# Patient Record
Sex: Female | Born: 1960 | Race: Black or African American | Hispanic: No | Marital: Single | Smoking: Current every day smoker
Health system: Southern US, Community
[De-identification: ages and names within clinical notes are randomized; demographics above are authoritative.]

## PROBLEM LIST (undated history)

## (undated) DIAGNOSIS — E785 Hyperlipidemia, unspecified: Secondary | ICD-10-CM

## (undated) HISTORY — PX: BREAST BIOPSY: SHX20

## (undated) HISTORY — DX: Hyperlipidemia, unspecified: E78.5

---

## 2004-06-09 ENCOUNTER — Other Ambulatory Visit: Admission: RE | Admit: 2004-06-09 | Discharge: 2004-06-09 | Payer: Self-pay | Admitting: Family Medicine

## 2004-06-18 ENCOUNTER — Encounter: Admission: RE | Admit: 2004-06-18 | Discharge: 2004-06-18 | Payer: Self-pay | Admitting: Family Medicine

## 2006-02-10 ENCOUNTER — Other Ambulatory Visit: Admission: RE | Admit: 2006-02-10 | Discharge: 2006-02-10 | Payer: Self-pay | Admitting: Family Medicine

## 2006-02-10 ENCOUNTER — Ambulatory Visit: Payer: Self-pay | Admitting: Family Medicine

## 2006-02-10 ENCOUNTER — Encounter: Payer: Self-pay | Admitting: Family Medicine

## 2006-02-17 ENCOUNTER — Encounter: Admission: RE | Admit: 2006-02-17 | Discharge: 2006-02-17 | Payer: Self-pay | Admitting: Family Medicine

## 2006-02-22 ENCOUNTER — Ambulatory Visit: Payer: Self-pay | Admitting: Family Medicine

## 2006-02-24 ENCOUNTER — Encounter: Admission: RE | Admit: 2006-02-24 | Discharge: 2006-02-24 | Payer: Self-pay | Admitting: Family Medicine

## 2006-03-17 ENCOUNTER — Ambulatory Visit: Payer: Self-pay | Admitting: Family Medicine

## 2007-02-08 IMAGING — US US ABDOMEN COMPLETE
1 series · 14 of 25 positions shown · non-contrast
Comparison: none

CLINICAL DATA: Abdominal pain, bloating. 
 ABDOMEN ULTRASOUND:
TECHNIQUE: Complete abdominal ultrasound examination was performed including evaluation of the liver, gallbladder, bile ducts, pancreas, kidneys, spleen, IVC, and abdominal aorta.

[Series 1: unknown · 14 of 72 slices shown]
[im 1/72]
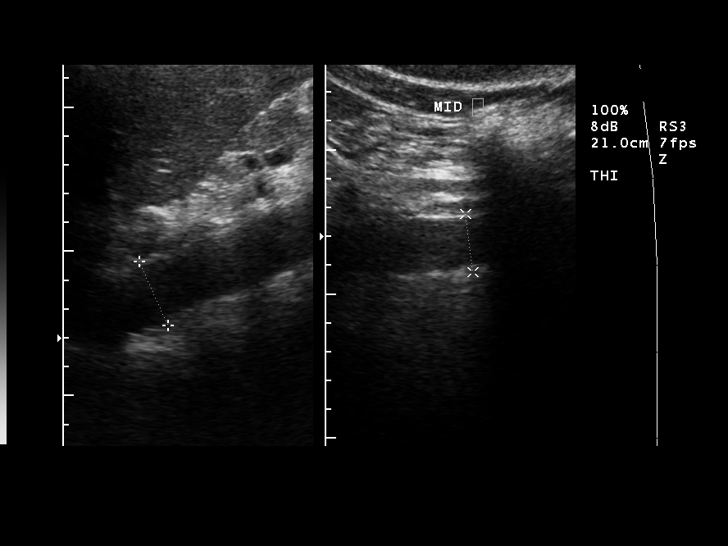
[im 6/72]
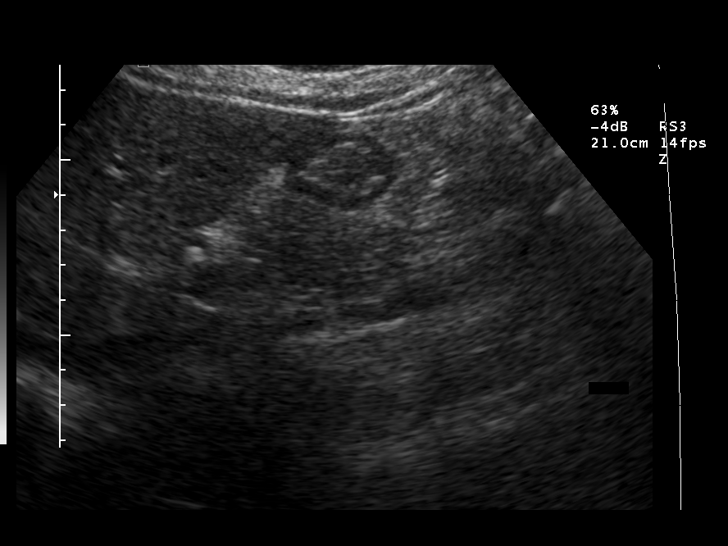
[im 12/72]
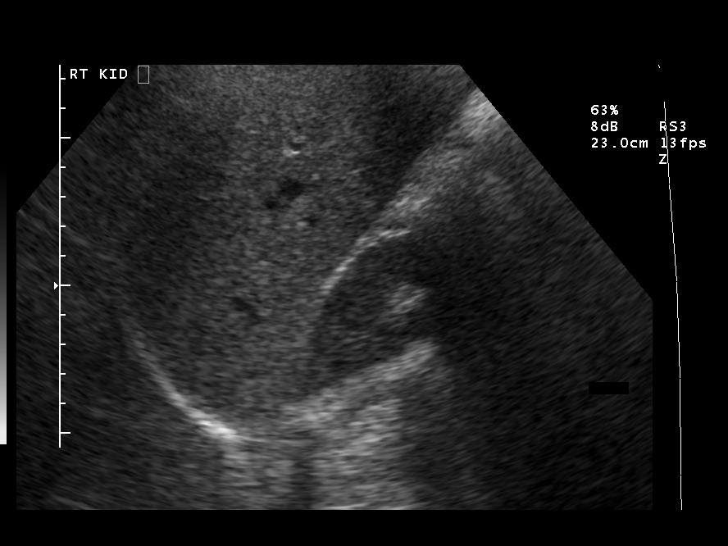
[im 18/72]
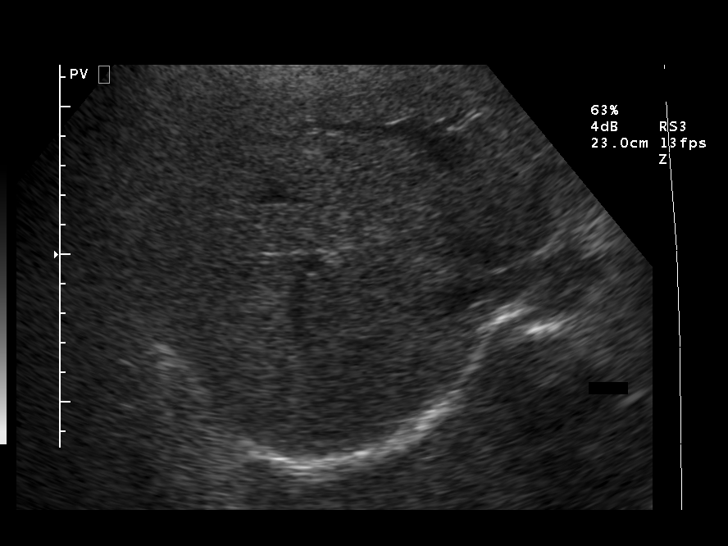
[im 24/72]
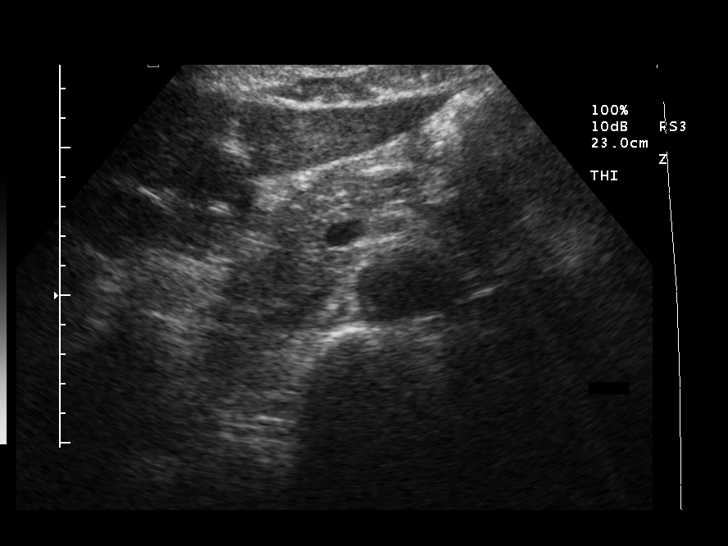
[im 27/72]
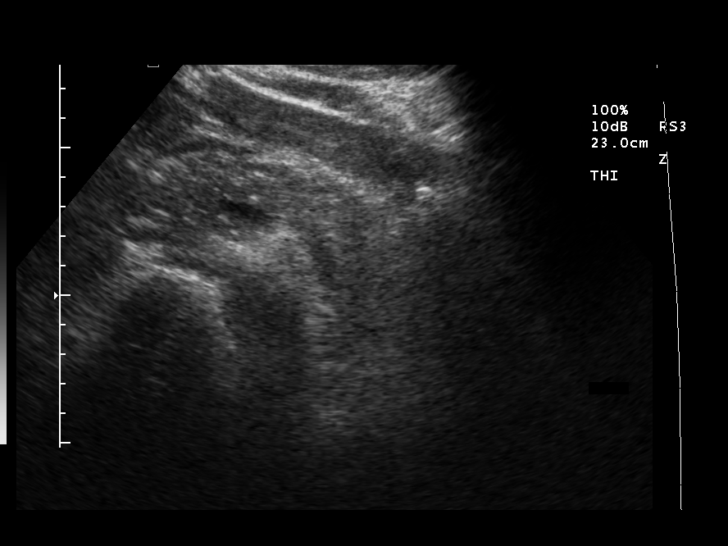
[im 33/72]
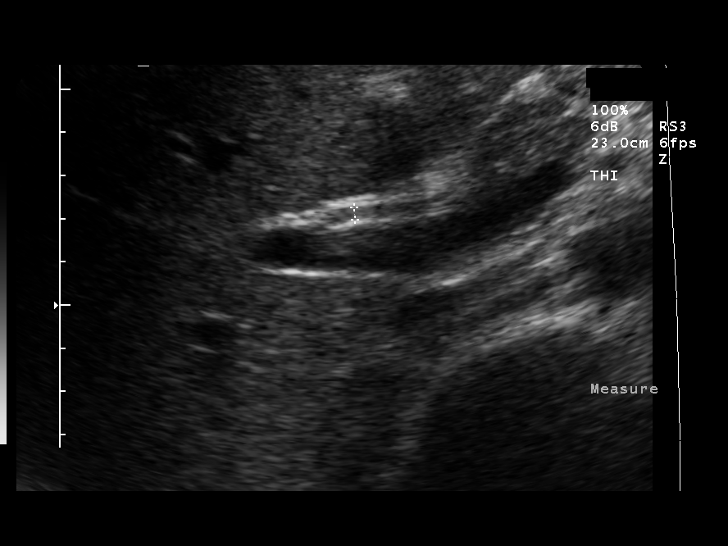
[im 39/72]
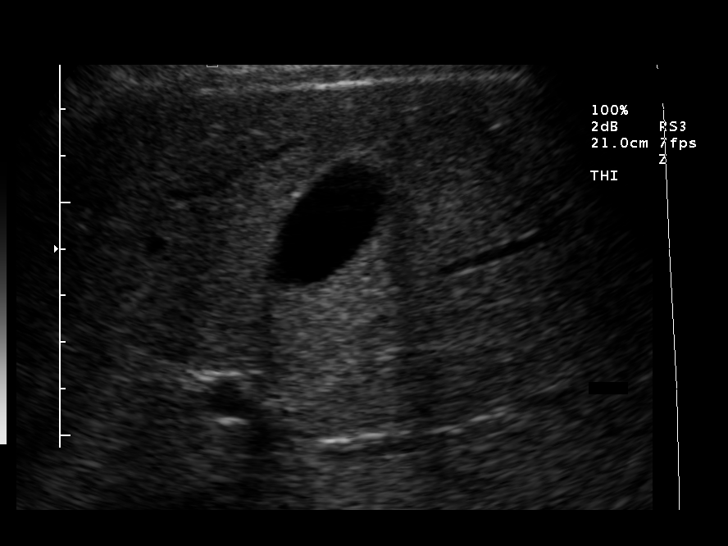
[im 45/72]
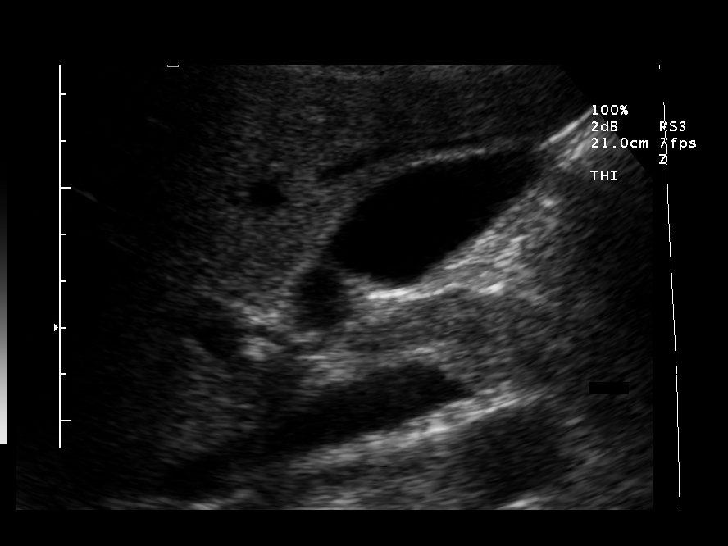
[im 48/72]
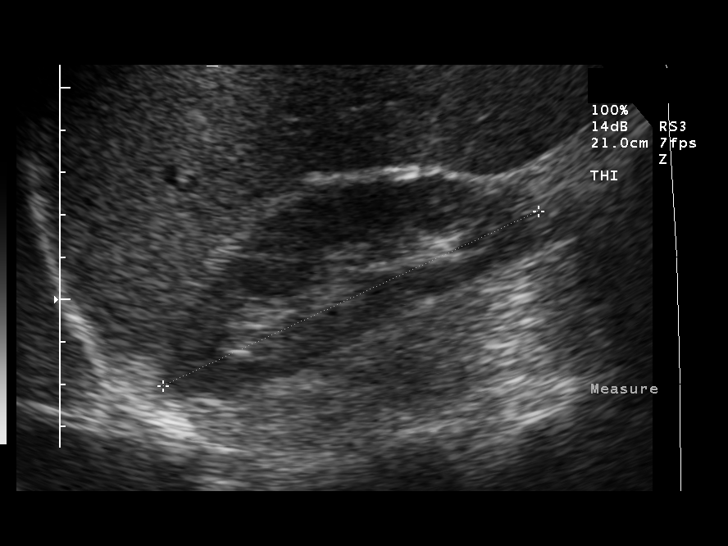
[im 54/72]
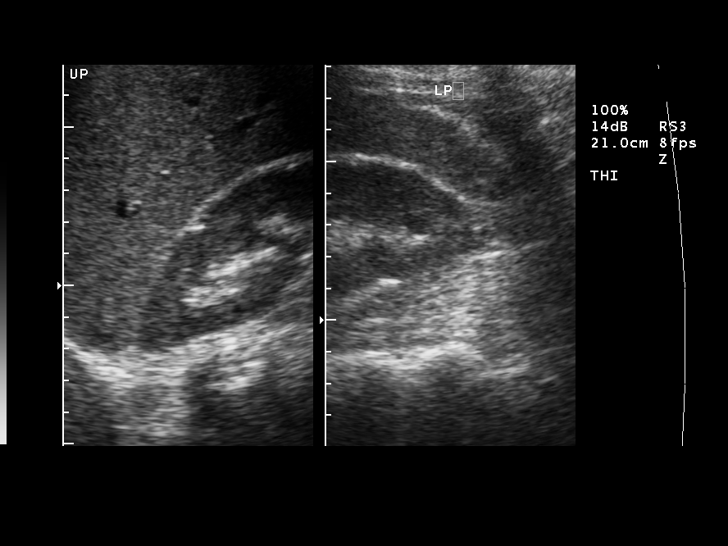
[im 60/72]
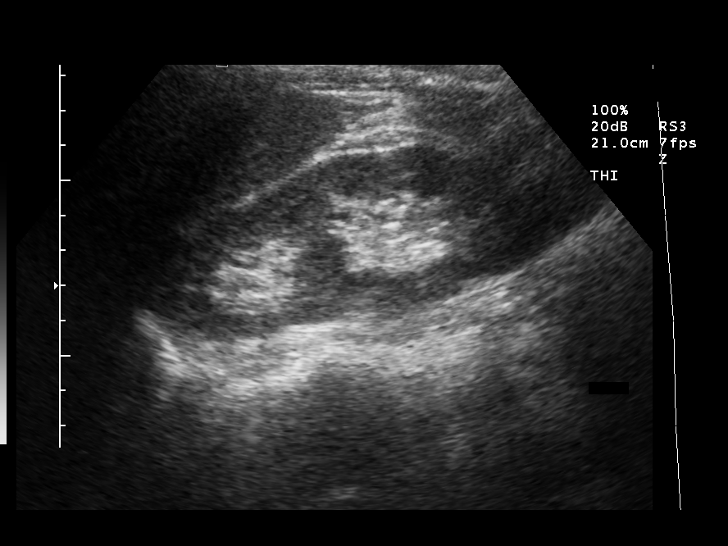
[im 66/72]
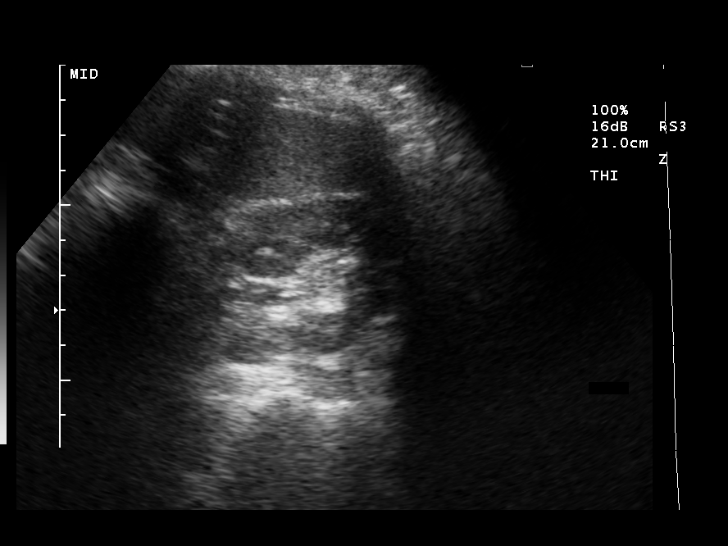
[im 72/72]
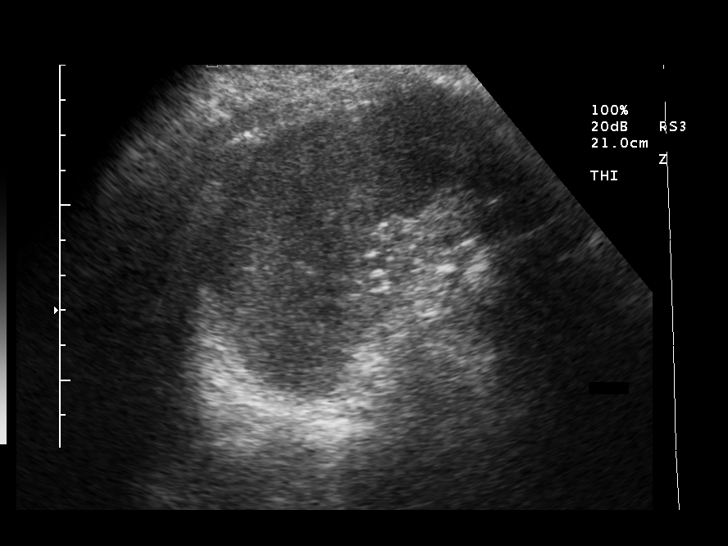

[14 of 25 positions shown; findings below may reference images not displayed]

FINDINGS: There is no evidence of gallstones or biliary ductal dilatation.  Common duct size is 3 mm.  The liver is within normal limits in echogenicity, and no focal liver lesions are seen.  The visualized portions of the IVC and pancreas are unremarkable.  There is no evidence of splenomegaly.  Right kidney length is 10.2 cm and the left kidney length is 11.4 cm.  The kidneys are unremarkable, and there is no evidence of hydronephrosis.  The abdominal aorta is nondilated.
IMPRESSION: Negative abdominal ultrasound.

## 2007-09-13 ENCOUNTER — Ambulatory Visit: Payer: Self-pay | Admitting: Internal Medicine

## 2007-09-13 DIAGNOSIS — R609 Edema, unspecified: Secondary | ICD-10-CM

## 2007-09-27 ENCOUNTER — Ambulatory Visit: Payer: Self-pay

## 2007-09-27 ENCOUNTER — Encounter: Payer: Self-pay | Admitting: Internal Medicine

## 2007-10-04 ENCOUNTER — Ambulatory Visit: Payer: Self-pay | Admitting: Family Medicine

## 2007-10-04 DIAGNOSIS — J328 Other chronic sinusitis: Secondary | ICD-10-CM | POA: Insufficient documentation

## 2007-10-04 DIAGNOSIS — N912 Amenorrhea, unspecified: Secondary | ICD-10-CM

## 2007-10-04 LAB — CONVERTED CEMR LAB
Beta hcg, urine, semiquantitative: NEGATIVE
Inflenza A Ag: NEGATIVE

## 2008-02-15 ENCOUNTER — Ambulatory Visit: Payer: Self-pay | Admitting: Family Medicine

## 2008-02-15 ENCOUNTER — Other Ambulatory Visit: Admission: RE | Admit: 2008-02-15 | Discharge: 2008-02-15 | Payer: Self-pay | Admitting: Family Medicine

## 2008-02-15 ENCOUNTER — Encounter: Payer: Self-pay | Admitting: Family Medicine

## 2008-02-16 ENCOUNTER — Encounter (INDEPENDENT_AMBULATORY_CARE_PROVIDER_SITE_OTHER): Payer: Self-pay | Admitting: *Deleted

## 2008-02-23 ENCOUNTER — Encounter (INDEPENDENT_AMBULATORY_CARE_PROVIDER_SITE_OTHER): Payer: Self-pay | Admitting: *Deleted

## 2008-04-10 ENCOUNTER — Telehealth (INDEPENDENT_AMBULATORY_CARE_PROVIDER_SITE_OTHER): Payer: Self-pay | Admitting: *Deleted

## 2008-10-18 ENCOUNTER — Encounter: Payer: Self-pay | Admitting: Family Medicine

## 2009-02-18 ENCOUNTER — Other Ambulatory Visit: Admission: RE | Admit: 2009-02-18 | Discharge: 2009-02-18 | Payer: Self-pay | Admitting: Family Medicine

## 2009-02-18 ENCOUNTER — Ambulatory Visit: Payer: Self-pay | Admitting: Family Medicine

## 2009-02-18 ENCOUNTER — Encounter: Payer: Self-pay | Admitting: Family Medicine

## 2009-02-18 DIAGNOSIS — Z780 Asymptomatic menopausal state: Secondary | ICD-10-CM | POA: Insufficient documentation

## 2009-02-18 DIAGNOSIS — N951 Menopausal and female climacteric states: Secondary | ICD-10-CM | POA: Insufficient documentation

## 2009-02-21 ENCOUNTER — Encounter (INDEPENDENT_AMBULATORY_CARE_PROVIDER_SITE_OTHER): Payer: Self-pay | Admitting: *Deleted

## 2009-02-22 ENCOUNTER — Telehealth (INDEPENDENT_AMBULATORY_CARE_PROVIDER_SITE_OTHER): Payer: Self-pay | Admitting: *Deleted

## 2009-02-22 ENCOUNTER — Encounter: Payer: Self-pay | Admitting: Family Medicine

## 2009-02-25 ENCOUNTER — Telehealth: Payer: Self-pay | Admitting: Family Medicine

## 2009-02-26 ENCOUNTER — Ambulatory Visit: Payer: Self-pay | Admitting: Family Medicine

## 2009-02-26 DIAGNOSIS — D518 Other vitamin B12 deficiency anemias: Secondary | ICD-10-CM | POA: Insufficient documentation

## 2009-02-27 ENCOUNTER — Ambulatory Visit: Payer: Self-pay | Admitting: Family Medicine

## 2009-02-28 ENCOUNTER — Encounter (INDEPENDENT_AMBULATORY_CARE_PROVIDER_SITE_OTHER): Payer: Self-pay | Admitting: *Deleted

## 2009-03-05 ENCOUNTER — Ambulatory Visit: Payer: Self-pay | Admitting: Family Medicine

## 2009-03-05 ENCOUNTER — Encounter (INDEPENDENT_AMBULATORY_CARE_PROVIDER_SITE_OTHER): Payer: Self-pay | Admitting: *Deleted

## 2009-03-12 ENCOUNTER — Encounter (INDEPENDENT_AMBULATORY_CARE_PROVIDER_SITE_OTHER): Payer: Self-pay | Admitting: *Deleted

## 2009-03-12 ENCOUNTER — Ambulatory Visit: Payer: Self-pay | Admitting: Family Medicine

## 2009-03-19 ENCOUNTER — Ambulatory Visit: Payer: Self-pay | Admitting: Family Medicine

## 2009-04-16 ENCOUNTER — Ambulatory Visit: Payer: Self-pay | Admitting: Family Medicine

## 2009-05-17 ENCOUNTER — Ambulatory Visit: Payer: Self-pay | Admitting: Family Medicine

## 2009-06-17 ENCOUNTER — Ambulatory Visit: Payer: Self-pay | Admitting: Family Medicine

## 2009-07-18 ENCOUNTER — Ambulatory Visit: Payer: Self-pay | Admitting: Family Medicine

## 2009-08-19 ENCOUNTER — Ambulatory Visit: Payer: Self-pay | Admitting: Family Medicine

## 2009-09-19 ENCOUNTER — Ambulatory Visit: Payer: Self-pay | Admitting: Family Medicine

## 2009-10-21 ENCOUNTER — Ambulatory Visit: Payer: Self-pay | Admitting: Family Medicine

## 2009-11-18 ENCOUNTER — Ambulatory Visit: Payer: Self-pay | Admitting: Family Medicine

## 2009-12-12 ENCOUNTER — Ambulatory Visit: Payer: Self-pay | Admitting: Family Medicine

## 2010-01-13 ENCOUNTER — Ambulatory Visit: Payer: Self-pay | Admitting: Family Medicine

## 2010-01-13 ENCOUNTER — Telehealth (INDEPENDENT_AMBULATORY_CARE_PROVIDER_SITE_OTHER): Payer: Self-pay | Admitting: *Deleted

## 2010-02-10 ENCOUNTER — Encounter (INDEPENDENT_AMBULATORY_CARE_PROVIDER_SITE_OTHER): Payer: Self-pay | Admitting: *Deleted

## 2010-02-11 ENCOUNTER — Ambulatory Visit: Payer: Self-pay | Admitting: Family Medicine

## 2010-03-20 ENCOUNTER — Ambulatory Visit: Payer: Self-pay | Admitting: Family Medicine

## 2010-03-20 ENCOUNTER — Other Ambulatory Visit: Admission: RE | Admit: 2010-03-20 | Discharge: 2010-03-20 | Payer: Self-pay | Admitting: Family Medicine

## 2010-03-20 DIAGNOSIS — K644 Residual hemorrhoidal skin tags: Secondary | ICD-10-CM | POA: Insufficient documentation

## 2010-03-20 DIAGNOSIS — E785 Hyperlipidemia, unspecified: Secondary | ICD-10-CM | POA: Insufficient documentation

## 2010-04-01 ENCOUNTER — Encounter: Payer: Self-pay | Admitting: Family Medicine

## 2010-04-03 ENCOUNTER — Ambulatory Visit: Payer: Self-pay | Admitting: Family Medicine

## 2010-04-03 LAB — CONVERTED CEMR LAB: Fecal Occult Bld: NEGATIVE

## 2010-04-18 ENCOUNTER — Ambulatory Visit: Payer: Self-pay | Admitting: Family Medicine

## 2010-05-19 ENCOUNTER — Ambulatory Visit: Payer: Self-pay | Admitting: Family Medicine

## 2010-06-16 ENCOUNTER — Ambulatory Visit: Payer: Self-pay | Admitting: Family Medicine

## 2010-07-17 ENCOUNTER — Ambulatory Visit: Payer: Self-pay | Admitting: Family Medicine

## 2010-08-14 ENCOUNTER — Ambulatory Visit: Payer: Self-pay | Admitting: Family Medicine

## 2010-09-15 ENCOUNTER — Other Ambulatory Visit: Payer: Self-pay | Admitting: Family Medicine

## 2010-09-15 ENCOUNTER — Ambulatory Visit: Admit: 2010-09-15 | Payer: Self-pay | Admitting: Family Medicine

## 2010-09-15 ENCOUNTER — Ambulatory Visit
Admission: RE | Admit: 2010-09-15 | Discharge: 2010-09-15 | Payer: Self-pay | Source: Home / Self Care | Attending: Family Medicine | Admitting: Family Medicine

## 2010-09-15 LAB — HEPATIC FUNCTION PANEL
ALT: 11 U/L (ref 0–35)
AST: 17 U/L (ref 0–37)
Albumin: 3.8 g/dL (ref 3.5–5.2)
Alkaline Phosphatase: 96 U/L (ref 39–117)
Bilirubin, Direct: 0.1 mg/dL (ref 0.0–0.3)
Total Bilirubin: 0.6 mg/dL (ref 0.3–1.2)
Total Protein: 6.8 g/dL (ref 6.0–8.3)

## 2010-09-15 LAB — LIPID PANEL
Cholesterol: 187 mg/dL (ref 0–200)
HDL: 50.8 mg/dL (ref >39.00)
LDL Cholesterol: 129 mg/dL — ABNORMAL HIGH (ref 0–99)
Total CHOL/HDL Ratio: 4
Triglycerides: 34 mg/dL (ref 0.0–149.0)
VLDL: 6.8 mg/dL (ref 0.0–40.0)

## 2010-09-17 ENCOUNTER — Telehealth (INDEPENDENT_AMBULATORY_CARE_PROVIDER_SITE_OTHER): Payer: Self-pay | Admitting: *Deleted

## 2010-09-22 ENCOUNTER — Encounter: Payer: Self-pay | Admitting: Family Medicine

## 2010-09-28 LAB — CONVERTED CEMR LAB
ALT: 12 units/L (ref 0–35)
ALT: 13 units/L (ref 0–35)
AST: 17 units/L (ref 0–37)
AST: 19 units/L (ref 0–37)
Albumin: 3.9 g/dL (ref 3.5–5.2)
Albumin: 4.2 g/dL (ref 3.5–5.2)
Alkaline Phosphatase: 1 units/L — ABNORMAL LOW (ref 39–117)
Alkaline Phosphatase: 92 units/L (ref 39–117)
BUN: 13 mg/dL (ref 6–23)
Basophils Absolute: 0 10*3/uL (ref 0.0–0.1)
Basophils Absolute: 0.1 10*3/uL (ref 0.0–0.1)
Bilirubin, Direct: 0.1 mg/dL (ref 0.0–0.3)
Bilirubin, Direct: 0.1 mg/dL (ref 0.0–0.3)
Chloride: 102 meq/L (ref 96–112)
Chloride: 105 meq/L (ref 96–112)
Cholesterol: 211 mg/dL — ABNORMAL HIGH (ref 0–200)
Cholesterol: 223 mg/dL — ABNORMAL HIGH (ref 0–200)
Creatinine, Ser: 0.8 mg/dL (ref 0.4–1.2)
Direct LDL: 151 mg/dL
Direct LDL: 161.4 mg/dL
Direct LDL: 163.3 mg/dL
Eosinophils Absolute: 0.3 10*3/uL (ref 0.0–0.7)
Eosinophils Relative: 3.2 % (ref 0.0–5.0)
Folate: 9.8 ng/mL
Free T4: 0.9 ng/dL (ref 0.6–1.6)
GFR calc Af Amer: 99 mL/min
Glucose, Bld: 92 mg/dL (ref 70–99)
HCT: 37.8 % (ref 36.0–46.0)
HCT: 38 % (ref 36.0–46.0)
HDL: 45.2 mg/dL (ref >39.0)
HDL: 47.7 mg/dL (ref >39.00)
HDL: 50.9 mg/dL (ref >39.00)
Hemoglobin: 13.5 g/dL (ref 12.0–15.0)
Ketones, urine, test strip: NEGATIVE
Lymphocytes Relative: 25.2 % (ref 12.0–46.0)
MCHC: 34 g/dL (ref 30.0–36.0)
MCHC: 35.4 g/dL (ref 30.0–36.0)
MCV: 83.5 fL (ref 78.0–100.0)
MCV: 84.2 fL (ref 78.0–100.0)
Neutro Abs: 5.2 10*3/uL (ref 1.4–7.7)
Neutro Abs: 5.8 10*3/uL (ref 1.4–7.7)
Neutrophils Relative %: 70 % (ref 43.0–77.0)
Nitrite: NEGATIVE
Pap Smear: NEGATIVE
Platelets: 262 10*3/uL (ref 150.0–400.0)
RBC: 4.51 M/uL (ref 3.87–5.11)
RBC: 4.52 M/uL (ref 3.87–5.11)
RDW: 12.4 % (ref 11.5–14.6)
Specific Gravity, Urine: 1.015
TSH: 1.24 microintl units/mL (ref 0.35–5.50)
Total Bilirubin: 0.5 mg/dL (ref 0.3–1.2)
Total CHOL/HDL Ratio: 4
Total CHOL/HDL Ratio: 4
Total CHOL/HDL Ratio: 4.3
Total Protein: 7 g/dL (ref 6.0–8.3)
Total Protein: 7.5 g/dL (ref 6.0–8.3)
Total Protein: 7.7 g/dL (ref 6.0–8.3)
Triglycerides: 59 mg/dL (ref 0.0–149.0)
Triglycerides: 60 mg/dL (ref 0–149)
Urobilinogen, UA: NEGATIVE
VLDL: 11.8 mg/dL (ref 0.0–40.0)
VLDL: 14.6 mg/dL (ref 0.0–40.0)
VLDL: 20.2 mg/dL (ref 0.0–40.0)
Vitamin B-12: 248 pg/mL (ref 211–911)
Vitamin B-12: >1500 pg/mL — ABNORMAL HIGH (ref 211–911)
WBC: 8.1 10*3/uL (ref 4.5–10.5)
WBC: 8.3 10*3/uL (ref 4.5–10.5)
pH: 6

## 2010-09-30 NOTE — Assessment & Plan Note (Signed)
Summary: b-12/cbs  Nurse Visit   Allergies: No Known Drug Allergies  Medication Administration  Injection # 1:    Medication: Vit B12 1000 mcg    Diagnosis: OTHER VITAMIN B12 DEFICIENCY ANEMIA (ICD-281.1)    Route: IM    Site: R deltoid    Exp Date: 10/30/2011    Lot #: [id number]    Mfr: American Regent    Patient tolerated injection without complications    Given by: Suzie Portela CMA (AAMA), Kimberly(April 17, 2010)  Orders Added: 1)  Admin of Therapeutic Inj  intramuscular or subcutaneous [96372] 2)  Vit B12 1000 mcg [J3420]

## 2010-09-30 NOTE — Progress Notes (Signed)
Summary: lm res lab to am fasting-OV  Phone Note Call from Patient   Summary of Call: patient is scheduled for b12 level 161096 - need lab order --- last b12 level was 045409 Initial call taken by: Okey Regal Spring,  Jan 13, 2010 4:17 PM  Follow-up for Phone Call        see labs 02-21-09:272.4, 281.1  hep, lipid,b12.......Marland KitchenFelecia Deloach CMA  Jan 14, 2010 8:57 AM   Additional Follow-up for Phone Call Additional follow up Details #1::        lm for patient to call & resch lab to am fasting   lm on home phone .Marland KitchenOkey Regal Spring  Jan 16, 2010 3:25 PM  patient has appt today patient didnt return call  .Marland KitchenOkey Regal Spring  February 10, 2010 10:43 AM   Additional Follow-up by: Okey Regal Spring,  Jan 14, 2010 10:47 AM    Additional Follow-up for Phone Call Additional follow up Details #2::    Pt should have OV also. Army Fossa CMA  January 29, 2010 9:20 AM   lm with a man for patient to call .Marland KitchenOkey Regal Spring  February 03, 2010 4:23 PM

## 2010-09-30 NOTE — Assessment & Plan Note (Signed)
Summary: b12 inj//lch  Nurse Visit   Allergies: No Known Drug Allergies  Medication Administration  Injection # 1:    Medication: Vit B12 1000 mcg    Diagnosis: OTHER VITAMIN B12 DEFICIENCY ANEMIA (ICD-281.1)    Route: IM    Site: L deltoid    Exp Date: 10/2011    Lot #: [id number]    Mfr: American Regent    Patient tolerated injection without complications    Given by: Army Fossa CMA (Jan 13, 2010 4:12 PM)  Orders Added: 1)  Admin of Therapeutic Inj  intramuscular or subcutaneous [96372] 2)  Vit B12 1000 mcg [J3420]

## 2010-09-30 NOTE — Assessment & Plan Note (Signed)
Summary: b12 shot/kdc  Nurse Visit   Allergies: No Known Drug Allergies  Medication Administration  Injection # 1:    Medication: Vit B12 1000 mcg    Diagnosis: OTHER VITAMIN B12 DEFICIENCY ANEMIA (ICD-281.1)    Route: IM    Site: L deltoid    Exp Date: 07/2011    Lot #: [id number]    Mfr: American Regent    Patient tolerated injection without complications    Given by: Floydene Flock (October 21, 2009 4:05 PM)  Orders Added: 1)  Admin of Therapeutic Inj  intramuscular or subcutaneous [96372] 2)  Vit B12 1000 mcg [J3420]   Medication Administration  Injection # 1:    Medication: Vit B12 1000 mcg    Diagnosis: OTHER VITAMIN B12 DEFICIENCY ANEMIA (ICD-281.1)    Route: IM    Site: L deltoid    Exp Date: 07/2011    Lot #: [id number]    Mfr: American Regent    Patient tolerated injection without complications    Given by: Floydene Flock (October 21, 2009 4:05 PM)  Orders Added: 1)  Admin of Therapeutic Inj  intramuscular or subcutaneous [96372] 2)  Vit B12 1000 mcg [J3420]

## 2010-09-30 NOTE — Letter (Signed)
Summary: [facility]: Immunoassay Fecal Occult Blood (iFOB) Order Form  [facility]  [address] [place], [place] [address]   Phone: [phone]  Fax: [phone]      [facility]: Immunoassay Fecal Occult Blood (iFOB) Order Form   April 01, 2010 MRN: [id number]   Lisa Lawson 08-30-1961   Physicican Name:  Loreen Freud, DO  Diagnosis Code:  281.1 and 455.3      Loreen Freud, DO

## 2010-09-30 NOTE — Assessment & Plan Note (Signed)
Summary: B-12 SHOT///PH  Nurse Visit   Allergies: No Known Drug Allergies  Medication Administration  Injection # 1:    Medication: Vit B12 1000 mcg    Diagnosis: OTHER VITAMIN B12 DEFICIENCY ANEMIA (ICD-281.1)    Route: IM    Site: R deltoid    Exp Date: 07/2011    Lot #: [id number]    Mfr: [employer] Regent    Patient tolerated injection without complications    Given by: Army Fossa CMA (November 18, 2009 4:21 PM)  Orders Added: 1)  Vit B12 1000 mcg [J3420] 2)  Admin of Therapeutic Inj  intramuscular or subcutaneous [04540]

## 2010-09-30 NOTE — Assessment & Plan Note (Signed)
Summary: cpx//pap//lch--B-12   Vital Signs:  Patient profile:   50 year old female Height:      66 inches Weight:      173 pounds BMI:     28.02 Temp:     98.3 degrees F oral Pulse rate:   82 / minute BP sitting:   100 / 62  (left arm)  Vitals Entered By: Jeremy Johann CMA (March 20, 2010 1:25 PM) CC: cpx, pap, fasting   History of Present Illness: Pt here for cpe, labs and pap.  No complaints.  Pt never took lipitor--she changed her diet and want to see how that worked.    Preventive Screening-Counseling & Management  Alcohol-Tobacco     Alcohol drinks/day: 0     Smoking Status: current     Smoking Cessation Counseling: yes     Smoke Cessation Stage: precontemplative     Packs/Day: 0.75     Year Started: 1980  Caffeine-Diet-Exercise     Caffeine use/day: 2-3     Does Patient Exercise: no     Exercise Counseling: to improve exercise regimen  Hep-HIV-STD-Contraception     HIV Risk: no     Dental Visit-last 6 months no     Dental Care Counseling: to seek dental care; no dental care within six months     SBE monthly: no     SBE Education/Counseling: to perform regular SBE  Safety-Violence-Falls     Seat Belt Use: yes     Firearms in the Home: firearms in the home     Firearm Counseling: not indicated; uses recommended firearm safety measures     Smoke Detectors: yes     Violence in the Home: no risk noted     Sexual Abuse: no      Sexual History:  currently monogamous.        Drug Use:  never.    Current Medications (verified): 1)  Hydrochlorothiazide 25 Mg  Tabs (Hydrochlorothiazide) .Marland Kitchen.. 1 By Mouth Once Daily 2)  Vitamin C 100 Mg Tabs (Ascorbic Acid) .Marland Kitchen.. 1 By Mouth Once Daily 3)  Magnesium 500 Mg Tabs (Magnesium) .Marland Kitchen.. 1 By Mouth Once Daily 4)  Vitamin B-12 250 Mcg Tabs (Cyanocobalamin) .Marland Kitchen.. 1 By Mouth Once Daily 5)  Cyanocobalamin 1000 Mcg/ml Soln (Cyanocobalamin) .Marland Kitchen.. 1 Ml Im Q Month  Allergies (verified): No Known Drug Allergies  Past  History:  Past Surgical History: Last updated: 09/13/2007 Caesarean section Breast Bx  Family History: Last updated: 09/13/2007 DM - mother HTN - mother CVA - no Lung Ca -Father  Social History: Last updated: 03/20/2010 Single 1 child Occupation:  [employer],  residential counselor Current Smoker Alcohol use-no Drug use-no Regular exercise-no  Risk Factors: Alcohol Use: 0 (03/20/2010) Caffeine Use: 2-3 (03/20/2010) Exercise: no (03/20/2010)  Risk Factors: Smoking Status: current (03/20/2010) Packs/Day: 0.75 (03/20/2010)  Past Medical History: G4P1 Hyperlipidemia  Family History: Reviewed history from 09/13/2007 and no changes required. DM - mother HTN - mother CVA - no Lung Ca -Father  Social History: Reviewed history from 02/15/2008 and no changes required. 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 Eyes:  Denies blurring, discharge, double vision, eye irritation, eye pain, halos, itching, light sensitivity, red eye, vision loss-1 eye, and vision  loss-both eyes; optho-- q1y--+ glasses. ENT:  Denies decreased hearing, difficulty swallowing, ear discharge, earache, hoarseness, nasal congestion, nosebleeds, postnasal drainage, ringing in ears, sinus pressure, and sore throat. CV:  Denies bluish discoloration of lips or nails, chest pain or discomfort, difficulty breathing at night, difficulty breathing while lying down, fainting, fatigue, leg cramps with exertion, lightheadness, near fainting, palpitations, shortness of breath with exertion, swelling of feet, swelling of hands, and weight gain. Resp:  Denies chest discomfort, chest pain with inspiration, cough, coughing up  blood, excessive snoring, hypersomnolence, morning headaches, pleuritic, shortness of breath, sputum productive, and wheezing. GI:  Denies abdominal pain, bloody stools, change in bowel habits, constipation, dark tarry stools, diarrhea, excessive appetite, gas, hemorrhoids, indigestion, loss of appetite, nausea, vomiting, vomiting blood, and yellowish skin color. GU:  Denies abnormal vaginal bleeding, decreased libido, discharge, dysuria, genital sores, hematuria, incontinence, nocturia, urinary frequency, and urinary hesitancy. MS:  Denies joint pain, joint redness, joint swelling, loss of strength, low back pain, mid back pain, muscle aches, muscle , cramps, muscle weakness, stiffness, and thoracic pain. Derm:  Denies changes in color of skin, changes in nail beds, dryness, excessive perspiration, flushing, hair loss, insect bite(s), itching, lesion(s), poor wound healing, and rash. Neuro:  Denies brief paralysis, difficulty with concentration, disturbances in coordination, falling down, headaches, inability to speak, memory loss, numbness, poor balance, seizures, sensation of room spinning, tingling, tremors, visual disturbances, and weakness. Psych:  Denies alternate hallucination ( auditory/visual), anxiety, depression, easily angered, easily tearful, irritability, mental problems, panic attacks, sense of great danger, suicidal thoughts/plans, thoughts of violence, unusual visions or sounds, and thoughts /plans of harming others. Endo:  Denies cold intolerance, excessive hunger, excessive thirst, excessive urination, heat intolerance, polyuria, and weight change. Heme:  Denies abnormal bruising, bleeding, enlarge lymph nodes, fevers, pallor, and skin discoloration. Allergy:  Denies hives or rash, itching eyes, persistent infections, seasonal allergies, and sneezing.  Physical Exam  General:  Well-developed,well-nourished,in no acute distress; alert,appropriate and cooperative throughout  examination Head:  Normocephalic and atraumatic without obvious abnormalities. No apparent alopecia or balding. Eyes:  vision grossly intact with glasses, pupils equal, pupils round, pupils reactive to light, and no injection.   Ears:  External ear exam shows no significant lesions or deformities.  Otoscopic examination reveals clear canals, tympanic membranes are intact bilaterally without bulging, retraction, inflammation or discharge. Hearing is grossly normal bilaterally. Nose:  External nasal examination shows no deformity or inflammation. Nasal mucosa are pink and moist without lesions or exudates. Mouth:  Oral mucosa and oropharynx without lesions or exudates.  Teeth in good repair. Neck:  No deformities, masses, or tenderness noted.no carotid bruits.   Chest Wall:  No deformities, masses, or tenderness noted. Breasts:  No mass, nodules, thickening, tenderness, bulging, retraction, inflamation, nipple discharge or skin changes noted.   Lungs:  Normal respiratory effort, chest expands symmetrically. Lungs are clear to auscultation, no crackles or wheezes. Heart:  normal rate and no murmur.   Abdomen:  Bowel sounds positive,abdomen soft and non-tender without masses, organomegaly or hernias noted. Rectal:  normal sphincter tone, no masses, and external hemorrhoid(s).  Heme negative brown stool Genitalia:  Pelvic Exam:        External: normal female genitalia without lesions or masses        Vagina: normal without lesions or masses        Cervix: normal without lesions or masses        Adnexa: normal bimanual exam without masses or fullness  Uterus: normal by palpation        Pap smear: performed Msk:  normal ROM, no joint tenderness, no joint swelling, no joint warmth, no redness over joints, no joint deformities, no joint instability, and no crepitation.   Pulses:  R posterior tibial normal, R dorsalis pedis normal, R carotid normal, L posterior tibial normal, L dorsalis pedis  normal, and L carotid normal.   Extremities:  No clubbing, cyanosis, edema, or deformity noted with normal full range of motion of all joints.   Neurologic:  No cranial nerve deficits noted. Station and gait are normal. Plantar reflexes are down-going bilaterally. DTRs are symmetrical throughout. Sensory, motor and coordinative functions appear intact. Skin:  Intact without suspicious lesions or rashes Cervical Nodes:  No lymphadenopathy noted Axillary Nodes:  No palpable lymphadenopathy Psych:  Cognition and judgment appear intact. Alert and cooperative with normal attention span and concentration. No apparent delusions, illusions, hallucinations   Impression & Recommendations:  Problem # 1:  PREVENTIVE HEALTH CARE (ICD-V70.0)  Orders: Venipuncture ([address]) TLB-B12 + Folate Pnl (60454_09811-B14/NWG) TLB-Lipid Panel (80061-LIPID) TLB-BMP (Basic Metabolic Panel-BMET) (80048-METABOL) TLB-CBC Platelet - w/Differential (85025-CBCD) TLB-Hepatic/Liver Function Pnl (80076-HEPATIC) TLB-TSH (Thyroid Stimulating Hormone) (84443-TSH) T-Vitamin D (25-Hydroxy) (95621-30865) EKG w/ Interpretation (93000)  Problem # 2:  HYPERLIPIDEMIA (ICD-272.4)  The following medications were removed from the medication list:    Lipitor 10 Mg Tabs (Atorvastatin calcium) .Marland Kitchen... 1 by mouth daily.  Orders: Venipuncture (78469) TLB-B12 + Folate Pnl (62952_84132-G40/NUU) TLB-Lipid Panel (80061-LIPID) TLB-BMP (Basic Metabolic Panel-BMET) (80048-METABOL) TLB-CBC Platelet - w/Differential (85025-CBCD) TLB-Hepatic/Liver Function Pnl (80076-HEPATIC) TLB-TSH (Thyroid Stimulating Hormone) (84443-TSH) T-Vitamin D (25-Hydroxy) (72536-64403) EKG w/ Interpretation (93000)  Labs Reviewed: SGOT: 17 (02/11/2010)   SGPT: 12 (02/11/2010)   HDL:55.20 (02/11/2010), 47.70 (02/18/2009)  LDL:137 (02/15/2008)  Chol:223 (02/11/2010), 202 (02/18/2009)  Trig:73.0 (02/11/2010), 59.0 (02/18/2009)  Problem # 3:  OTHER VITAMIN B12  DEFICIENCY ANEMIA (ICD-281.1)  Her updated medication list for this problem includes:    Vitamin B-12 250 Mcg Tabs (Cyanocobalamin) .Marland Kitchen... 1 by mouth once daily    Cyanocobalamin 1000 Mcg/ml Soln (Cyanocobalamin) .Marland Kitchen... 1 ml im q month  Orders: Venipuncture ([address]25) TLB-B12 + Folate Pnl (95638_75643-P29/JJO) TLB-Lipid Panel (80061-LIPID) TLB-BMP (Basic Metabolic Panel-BMET) (80048-METABOL) TLB-CBC Platelet - w/Differential (85025-CBCD) TLB-Hepatic/Liver Function Pnl (80076-HEPATIC) TLB-TSH (Thyroid Stimulating Hormone) (84443-TSH) T-Vitamin D (25-Hydroxy) (84166-06301) EKG w/ Interpretation (93000)  Hgb: 13.5 (02/18/2009)   Hct: 38.0 (02/18/2009)   Platelets: 262.0 (02/18/2009) RBC: 4.51 (02/18/2009)   RDW: 12.2 (02/18/2009)   WBC: 8.1 (02/18/2009) MCV: 84.2 (02/18/2009)   MCHC: 35.4 (02/18/2009) B12: 410 (02/11/2010)   Folate: 11.1 (02/18/2009)   TSH: 1.57 (02/18/2009)  Problem # 4:  POSTMENOPAUSAL STATUS (ICD-V49.81)  Orders: Venipuncture (60109) TLB-B12 + Folate Pnl (32355_73220-U54/YHC) TLB-Lipid Panel (80061-LIPID) TLB-BMP (Basic Metabolic Panel-BMET) (80048-METABOL) TLB-CBC Platelet - w/Differential (85025-CBCD) TLB-Hepatic/Liver Function Pnl (80076-HEPATIC) TLB-TSH (Thyroid Stimulating Hormone) (84443-TSH) T-Vitamin D (25-Hydroxy) (62376-28315) EKG w/ Interpretation (93000)  Problem # 5:  EXTERNAL HEMORRHOIDS WITHOUT MENTION COMP (ICD-455.3)  proctosol hc  gi or surgery if no better increase fiber in diet  Orders: EKG w/ Interpretation (93000)  Complete Medication List: 1)  Hydrochlorothiazide 25 Mg Tabs (Hydrochlorothiazide) .Marland Kitchen.. 1 by mouth once daily 2)  Vitamin C 100 Mg Tabs (Ascorbic acid) .Marland Kitchen.. 1 by mouth once daily 3)  Magnesium 500 Mg Tabs (Magnesium) .Marland Kitchen.. 1 by mouth once daily 4)  Vitamin B-12 250 Mcg Tabs (Cyanocobalamin) .Marland Kitchen.. 1 by mouth once daily 5)  Cyanocobalamin 1000 Mcg/ml Soln (Cyanocobalamin) .Marland Kitchen.. 1 ml im q month 6)  Proctosol Hc 2.5 % Crea  (Hydrocortisone) .... Apply two times a day Prescriptions: PROCTOSOL HC 2.5 % CREA (HYDROCORTISONE) apply two times a day  #1 tube x 1   Entered and Authorized by:   Loreen Freud DO   Signed by:   Loreen Freud DO on 03/20/2010   Method used:   Electronically to        [facility] [phone]* (retail)       [address]       [place], [place]  [address]       Ph: [phone]       Fax: [phone]   RxID:   [id number]    EKG  Procedure date:  03/20/2010  Findings:      Normal sinus rhythm with rate of:  78 bpm    Flu Vaccine Result Date:  06/12/2009 Flu Vaccine Result:  given Flu Vaccine Next Due:  1 yr  Appended Document: cpx//pap//lch--B-12    Clinical Lists Changes  Orders: Added new Service order of Specimen Handling (70623) - Signed Added new Service order of UA Dipstick w/o Micro (manual) (81002) - Signed Observations: Added new observation of PH URINE: 8.5  (03/20/2010 14:27) Added new observation of SPEC GR URIN: 1.010  (03/20/2010 14:27) Added new observation of WBC DIPSTK U: negative  (03/20/2010 14:27) Added new observation of NITRITE URN: negative  (03/20/2010 14:27) Added new observation of UROBILINOGEN: 0.2  (03/20/2010 14:27) Added new observation of PROTEIN, URN: negative  (03/20/2010 14:27) Added new observation of BLOOD UR DIP: negative  (03/20/2010 14:27) Added new observation of KETONES URN: negative  (03/20/2010 14:27) Added new observation of BILIRUBIN UR: negative  (03/20/2010 14:27) Added new observation of GLUCOSE, URN: negative  (03/20/2010 14:27)      Laboratory Results   Urine Tests    Routine Urinalysis   Glucose: negative   (Normal Range: Negative) Bilirubin: negative   (Normal Range: Negative) Ketone: negative   (Normal Range: Negative) Spec. Gravity: 1.010   (Normal Range: 1.003-1.035) Blood: negative   (Normal Range: Negative) pH: 8.5   (Normal Range: 5.0-8.0) Protein: negative   (Normal  Range: Negative) Urobilinogen: 0.2   (Normal Range: 0-1) Nitrite: negative   (Normal Range: Negative) Leukocyte Esterace: negative   (Normal Range: Negative)       Appended Document: cpx//pap//lch--B-12   Medication Administration  Injection # 1:    Medication: Vit B12 1000 mcg    Diagnosis: OTHER VITAMIN B12 DEFICIENCY ANEMIA (ICD-281.1)    Route: IM    Site: R deltoid    Exp Date: 10/30/2011    Lot #: [id number]    Mfr: American Regent    Patient tolerated injection without complications    Given by: Jeremy Johann CMA (March 20, 2010 2:32 PM)  Orders Added: 1)  Vit B12 1000 mcg [J3420] 2)  Admin of Therapeutic Inj  intramuscular or subcutaneous [96372]   Appended Document: cpx//pap//lch--B-12    Clinical Lists Changes  Observations: Added new observation of MAMMO DUE: 02/12/2011 (04/02/2010 9:48) Added new observation of LAST MAM DAT: 02/11/2010 (02/11/2010 9:49) Added new observation of MAMMOGRAM: normal (02/11/2010 9:49)         Mammogram Result Date:  02/11/2010 Mammogram Result:  normal Mammogram Next Due:  1 yr

## 2010-09-30 NOTE — Assessment & Plan Note (Signed)
Summary: b12 shot/kdc  Nurse Visit   Allergies: No Known Drug Allergies  Medication Administration  Injection # 1:    Medication: Vit B12 1000 mcg    Diagnosis: OTHER VITAMIN B12 DEFICIENCY ANEMIA (ICD-281.1)    Route: IM    Site: R deltoid    Exp Date: 05/2011    Lot #: [id number]    Mfr: American Regent    Patient tolerated injection without complications    Given by: Floydene Flock (September 19, 2009 4:19 PM)  Orders Added: 1)  Admin of Therapeutic Inj  intramuscular or subcutaneous [96372] 2)  Vit B12 1000 mcg [J3420]   Medication Administration  Injection # 1:    Medication: Vit B12 1000 mcg    Diagnosis: OTHER VITAMIN B12 DEFICIENCY ANEMIA (ICD-281.1)    Route: IM    Site: R deltoid    Exp Date: 05/2011    Lot #: [id number]    Mfr: American Regent    Patient tolerated injection without complications    Given by: Floydene Flock (September 19, 2009 4:19 PM)  Orders Added: 1)  Admin of Therapeutic Inj  intramuscular or subcutaneous [96372] 2)  Vit B12 1000 mcg [J3420]

## 2010-09-30 NOTE — Assessment & Plan Note (Signed)
Summary: B-12 SHOT///SPH  Nurse Visit   Allergies: No Known Drug Allergies  Medication Administration  Injection # 1:    Medication: Vit B12 1000 mcg    Diagnosis: OTHER VITAMIN B12 DEFICIENCY ANEMIA (ICD-281.1)    Route: IM    Site: R deltoid    Exp Date: 10/30/2011    Lot #: [id number]    Mfr: American Regent    Patient tolerated injection without complications    Given by: Jeremy Johann CMA (July 17, 2010 4:31 PM)  Orders Added: 1)  Admin of Therapeutic Inj  intramuscular or subcutaneous [96372] 2)  Vit B12 1000 mcg [J3420]

## 2010-09-30 NOTE — Assessment & Plan Note (Signed)
Summary: b12 shot/nta  Nurse Visit   Allergies: No Known Drug Allergies  Medication Administration  Injection # 1:    Medication: Vit B12 1000 mcg    Diagnosis: OTHER VITAMIN B12 DEFICIENCY ANEMIA (ICD-281.1)    Route: IM    Site: R deltoid    Exp Date: 10/2011    Lot #: [id number]    Mfr: American Regent    Patient tolerated injection without complications    Given by: Army Fossa CMA (December 12, 2009 4:16 PM)  Orders Added: 1)  Admin of Therapeutic Inj  intramuscular or subcutaneous [96372] 2)  Vit B12 1000 mcg [J3420]

## 2010-09-30 NOTE — Assessment & Plan Note (Signed)
Summary: b12/kn  Nurse Visit   Allergies: No Known Drug Allergies  Medication Administration  Injection # 1:    Medication: Vit B12 1000 mcg    Diagnosis: OTHER VITAMIN B12 DEFICIENCY ANEMIA (ICD-281.1)    Route: IM    Site: R deltoid    Exp Date: 10/30/2011    Lot #: [id number]    Mfr: American Regent    Patient tolerated injection without complications    Given by: Almeta Monas CMA Duncan Dull) (May 19, 2010 5:19 PM)  Orders Added: 1)  Admin of Therapeutic Inj  intramuscular or subcutaneous [96372] 2)  Vit B12 1000 mcg [J3420]

## 2010-09-30 NOTE — Assessment & Plan Note (Signed)
Summary: B-12/CBS  Nurse Visit   Allergies: No Known Drug Allergies  Medication Administration  Injection # 1:    Medication: Vit B12 1000 mcg    Diagnosis: OTHER VITAMIN B12 DEFICIENCY ANEMIA (ICD-281.1)    Route: IM    Site: L deltoid    Exp Date: 10/30/2011    Lot #: [id number]    Mfr: American Regent    Patient tolerated injection without complications    Given by: Almeta Monas CMA Duncan Dull) (June 16, 2010 4:42 PM)  Orders Added: 1)  Admin of Therapeutic Inj  intramuscular or subcutaneous [44010]

## 2010-10-02 NOTE — Assessment & Plan Note (Signed)
Summary: b-12/cbs  Nurse Visit   Allergies: No Known Drug Allergies  Medication Administration  Injection # 1:    Medication: Vit B12 1000 mcg    Diagnosis: OTHER VITAMIN B12 DEFICIENCY ANEMIA (ICD-281.1)    Route: IM    Site: L deltoid    Exp Date: 10/30/2011    Lot #: [id number]    Mfr: American Regent    Patient tolerated injection without complications    Given by: Jeremy Johann CMA (August 14, 2010 4:25 PM)  Orders Added: 1)  Admin of Therapeutic Inj  intramuscular or subcutaneous [96372] 2)  Vit B12 1000 mcg [J3420]

## 2010-10-02 NOTE — Progress Notes (Signed)
Summary: Results--No ans 1/18,1/20  Phone Note Outgoing Call   Call placed by: Almeta Monas CMA Duncan Dull),  September 17, 2010 2:55 PM Call placed to: Patient Details for Reason: Ideally your LDL (bad cholesterol) should be <__100__, your HDL (good cholesterol) should be >_40__ and your triglycerides should be< 150.  Diet and exercise will increase HDL and decrease the LDL and triglycerides. Read Dr. Danice Goltz book--Eat Drink and Be Healthy.  Increase Lipitor to 20 mg #30. 1 by mouth at bedtime.--2 refills.   We will recheck labs in __3_ months.  hep, lipid 272.4 Summary of Call: No Answer will try again later..... Almeta Monas CMA Duncan Dull)  September 17, 2010 2:56 PM  No Answer will try again later.Marland KitchenMarland KitchenMarland KitchenDoristine Devoid CMA  September 19, 2010 4:56 PM   No answer....Marland Kitchenletter mailed... Almeta Monas CMA Duncan Dull)  September 22, 2010 9:12 AM      Appended Document: Results--No ans 1/18,1/20   Pt return call re-letter, left message to call office.Felecia Deloach CMA  September 25, 2010 4:23 PM   Pt decline to increase to Lipitor 20 mg. Pt states that she will work on diet and exercise and repeat test in 3 months.Felecia Deloach CMA  September 26, 2010 8:59 AM

## 2010-10-02 NOTE — Letter (Signed)
Summary: Primary Care Appointment Letter  [facility]  [address] [place], [place] [address]   Phone: [phone]  Fax: [phone]    09/22/2010 MRN: [id number]  Lisa Lawson [address] [place], [place]  [address]  Dear Ms. Lisa Lawson,   Your Primary Care Physician Loreen Freud DO has indicated that:    _______it is time to schedule an appointment.    _______you missed your appointment on______ and need to call and          reschedule.    _______you need to have lab work done.    __x_____you need to call the office to discuss lab or test results.    _______you need to call to reschedule your appointment that is                       scheduled on _________.     Please call our office as soon as possible. Our phone number is [phone]          _________. Please press option 1. Our office is open 8a-12noon and 1p-5p, Monday through Friday.     Thank you,     Primary Care Scheduler

## 2010-10-14 ENCOUNTER — Telehealth (INDEPENDENT_AMBULATORY_CARE_PROVIDER_SITE_OTHER): Payer: Self-pay | Admitting: *Deleted

## 2010-10-15 ENCOUNTER — Encounter: Payer: Self-pay | Admitting: Family Medicine

## 2010-10-15 ENCOUNTER — Ambulatory Visit (INDEPENDENT_AMBULATORY_CARE_PROVIDER_SITE_OTHER): Payer: BC Managed Care – PPO

## 2010-10-15 DIAGNOSIS — D518 Other vitamin B12 deficiency anemias: Secondary | ICD-10-CM

## 2010-10-17 ENCOUNTER — Ambulatory Visit: Payer: Self-pay

## 2010-10-22 NOTE — Progress Notes (Signed)
Summary: B-12    Phone Note Other Incoming   Summary of Call: Patient has upcoming appt for B-12 inj and per flowsheet the last b-12 labs were done in 2010 (248). When should this patient have B-12 re-checked? Please advise. Lisa Lawson CMA  October 14, 2010 9:33 AM   Follow-up for Phone Call        it was done in July Follow-up by: Lisa Freud DO,  October 14, 2010 9:42 AM  Additional Follow-up for Phone Call Additional follow up Details #1::        I forgot the flowsheet reads backwards, last level was greater than 1500. How much longer should patient continue monthly B-12 inj? Additional Follow-up by: Lisa Lawson CMA,  October 14, 2010 9:52 AM    Additional Follow-up for Phone Call Additional follow up Details #2::    she can con't  or she can try sublingual B12 ----and we can recheck in 1 month  Lisa Freud DO,  October 14, 2010 11:57 AM  Called # on DPR ([phone])and automated message states that the number is not reachable. Called home # and unable to leave message because message capacity is full. Lisa Lawson CMA  October 14, 2010 3:50 PM   Called home # again, same as above. Lisa Lawson CMA  October 15, 2010 9:20 AM   Discuss with patient .....Marland KitchenMarland KitchenFelecia Lawson CMA  October 15, 2010 5:08 PM

## 2010-10-22 NOTE — Assessment & Plan Note (Signed)
Summary: b12 inj/lch  Nurse Visit   Allergies: No Known Drug Allergies  Medication Administration  Injection # 1:    Medication: Vit B12 1000 mcg    Diagnosis: OTHER VITAMIN B12 DEFICIENCY ANEMIA (ICD-281.1)    Route: IM    Site: L deltoid    Exp Date: 10/30/2011    Lot #: [id number]    Mfr: [employer] Regent    Comments: Pt to have level checked at next inection    Patient tolerated injection without complications    Given by: Jeremy Johann CMA (October 15, 2010 4:30 PM)  Orders Added: 1)  Vit B12 1000 mcg [J3420] 2)  Admin of Therapeutic Inj  intramuscular or subcutaneous [16109]

## 2010-11-13 ENCOUNTER — Other Ambulatory Visit: Payer: Self-pay | Admitting: Family Medicine

## 2010-11-13 ENCOUNTER — Encounter: Payer: Self-pay | Admitting: Family Medicine

## 2010-11-13 ENCOUNTER — Ambulatory Visit (INDEPENDENT_AMBULATORY_CARE_PROVIDER_SITE_OTHER): Payer: BC Managed Care – PPO

## 2010-11-13 DIAGNOSIS — D529 Folate deficiency anemia, unspecified: Secondary | ICD-10-CM

## 2010-11-13 DIAGNOSIS — D518 Other vitamin B12 deficiency anemias: Secondary | ICD-10-CM

## 2010-11-14 LAB — B12 AND FOLATE PANEL: Vitamin B-12: 605 pg/mL (ref 211–911)

## 2010-11-18 NOTE — Assessment & Plan Note (Signed)
Summary: b12 level & b12 inj/cbs  Nurse Visit   Allergies: No Known Drug Allergies  Medication Administration  Injection # 1:    Medication: Vit B12 1000 mcg    Diagnosis: OTHER VITAMIN B12 DEFICIENCY ANEMIA (ICD-281.1)    Route: IM    Site: R deltoid    Exp Date: 10/30/2011    Lot #: [id number]    Mfr: [employer] Regent    Patient tolerated injection without complications    Given by: Jeremy Johann CMA (November 13, 2010 4:25 PM)  Orders Added: 1)  Venipuncture [40981] 2)  Specimen Handling [99000] 3)  TLB-B12 + Folate Pnl [[id number]-B12/FOL] 4)  Specimen Handling [99000] 5)  Admin of Therapeutic Inj  intramuscular or subcutaneous [96372] 6)  Vit B12 1000 mcg [J3420]

## 2011-03-23 ENCOUNTER — Encounter: Payer: Self-pay | Admitting: Family Medicine

## 2011-03-23 ENCOUNTER — Other Ambulatory Visit (HOSPITAL_COMMUNITY)
Admission: RE | Admit: 2011-03-23 | Discharge: 2011-03-23 | Disposition: A | Discharge status: Home / Self Care | Payer: BC Managed Care – PPO | Source: Ambulatory Visit | Attending: Family Medicine | Admitting: Family Medicine

## 2011-03-23 ENCOUNTER — Ambulatory Visit (INDEPENDENT_AMBULATORY_CARE_PROVIDER_SITE_OTHER): Payer: BC Managed Care – PPO | Admitting: Family Medicine

## 2011-03-23 VITALS — BP 124/88 | HR 82 | Temp 98.8°F | Ht 66.0 in | Wt 170.8 lb

## 2011-03-23 DIAGNOSIS — R319 Hematuria, unspecified: Secondary | ICD-10-CM

## 2011-03-23 DIAGNOSIS — Z01419 Encounter for gynecological examination (general) (routine) without abnormal findings: Secondary | ICD-10-CM | POA: Insufficient documentation

## 2011-03-23 DIAGNOSIS — Z0000 Encounter for general adult medical examination without abnormal findings: Secondary | ICD-10-CM

## 2011-03-23 LAB — CBC WITH DIFFERENTIAL/PLATELET
Basophils Relative: 0.5 % (ref 0.0–3.0)
Hemoglobin: 13.3 g/dL (ref 12.0–15.0)
Lymphs Abs: 2.1 10*3/uL (ref 0.7–4.0)
Monocytes Relative: 6.1 % (ref 3.0–12.0)
Neutro Abs: 6.3 10*3/uL (ref 1.4–7.7)
Neutrophils Relative %: 69.2 % (ref 43.0–77.0)
RDW: 13.2 % (ref 11.5–14.6)

## 2011-03-23 LAB — HEPATIC FUNCTION PANEL
Total Bilirubin: 0.5 mg/dL (ref 0.3–1.2)
Total Protein: 8.4 g/dL — ABNORMAL HIGH (ref 6.0–8.3)

## 2011-03-23 LAB — LDL CHOLESTEROL, DIRECT: Direct LDL: 149.3 mg/dL

## 2011-03-23 LAB — BASIC METABOLIC PANEL
CO2: 30 mEq/L (ref 19–32)
Chloride: 99 mEq/L (ref 96–112)
Creatinine, Ser: 0.8 mg/dL (ref 0.4–1.2)
Glucose, Bld: 88 mg/dL (ref 70–99)
Sodium: 137 mEq/L (ref 135–145)

## 2011-03-23 LAB — TSH: TSH: 0.95 u[IU]/mL (ref 0.35–5.50)

## 2011-03-23 LAB — POCT URINALYSIS DIPSTICK
Bilirubin, UA: NEGATIVE
Blood, UA: NEGATIVE
Leukocytes, UA: NEGATIVE
Nitrite, UA: NEGATIVE
Spec Grav, UA: 1.02
Urobilinogen, UA: 0.2
pH, UA: 6

## 2011-03-23 LAB — HEMOGLOBIN A1C: Hgb A1c MFr Bld: 6.2 % (ref 4.6–6.5)

## 2011-03-23 NOTE — Progress Notes (Signed)
Subjective:     Lisa Lawson is a 50 y.o. female and is here for a comprehensive physical exam. The patient reports problems - r ankle swelling after flying a lot but it is now back to normal..  History   Social History  . Marital Status: Single    Spouse Name: N/A    Number of Children: N/A  . Years of Education: N/A   Occupational History  . Not on file.   Social History Main Topics  . Smoking status: Current Everyday Smoker -- 0.8 packs/day for 30 years    Types: Cigarettes  . Smokeless tobacco: Never Used  . Alcohol Use: Yes     rare  . Drug Use: No  . Sexually Active: Yes -- Female partner(s)   Other Topics Concern  . Not on file   Social History Narrative  . No narrative on file   Health Maintenance  Topic Date Due  . Pap Smear  07/21/1979  . Tetanus/tdap  02/11/2016    The following portions of the patient's history were reviewed and updated as appropriate: allergies, current medications, past family history, past medical history, past social history, past surgical history and problem list.  Review of Systems Review of Systems  Constitutional: Negative for activity change, appetite change and fatigue.  HENT: Negative for hearing loss, congestion, tinnitus and ear discharge.  dentist q26m Eyes: Negative for visual disturbance (see optho q1y -- vision corrected to 20/20 with glasses).  Respiratory: Negative for cough, chest tightness and shortness of breath.   Cardiovascular: Negative for chest pain, palpitations and leg swelling.  Gastrointestinal: Negative for abdominal pain, diarrhea, constipation and abdominal distention.  Genitourinary: Negative for urgency, frequency, decreased urine volume and difficulty urinating.  Musculoskeletal: Negative for back pain, arthralgias and gait problem.  Skin: Negative for color change, pallor and rash.  Neurological: Negative for dizziness, light-headedness, numbness and headaches.  Hematological: Negative for  adenopathy. Does not bruise/bleed easily.  Psychiatric/Behavioral: Negative for suicidal ideas, confusion, sleep disturbance, self-injury, dysphoric mood, decreased concentration and agitation.       Objective:    BP 124/88  Pulse 82  Temp(Src) 98.8 F (37.1 C) (Oral)  Ht 5\' 6"  (1.676 m)  Wt 170 lb 12.8 oz (77.474 kg)  BMI 27.57 kg/m2  SpO2 99% General appearance: alert, cooperative, appears stated age and no distress Head: Normocephalic, without obvious abnormality, atraumatic Eyes: conjunctivae/corneas clear. PERRL, EOM's intact. Fundi benign. Ears: normal TM's and external ear canals both ears Nose: Nares normal. Septum midline. Mucosa normal. No drainage or sinus tenderness. Throat: lips, mucosa, and tongue normal; teeth and gums normal Neck: no adenopathy, no carotid bruit, no JVD, supple, symmetrical, trachea midline and thyroid not enlarged, symmetric, no tenderness/mass/nodules Back: symmetric, no curvature. ROM normal. No CVA tenderness. Lungs: clear to auscultation bilaterally Breasts: normal appearance, no masses or tenderness Heart: regular rate and rhythm, S1, S2 normal, no murmur, click, rub or gallop Abdomen: soft, non-tender; bowel sounds normal; no masses,  no organomegaly Pelvic: cervix normal in appearance, external genitalia normal, no adnexal masses or tenderness, no cervical motion tenderness, rectovaginal septum normal, uterus normal size, shape, and consistency and vagina normal without discharge Extremities: extremities normal, atraumatic, no cyanosis or edema Pulses: 2+ and symmetric Skin: Skin color, texture, turgor normal. No rashes or lesions Lymph nodes: Cervical, supraclavicular, and axillary nodes normal. Neurologic: Alert and oriented X 3, normal strength and tone. Normal symmetric reflexes. Normal coordination and gait psych--no anxiety or depression   rectal--heme neg  brown stool Assessment:    Healthy female exam.  edema   Plan:    cont  hctz ghm utd--schedule colon for after bday Check labs  See After Visit Summary for Counseling Recommendations

## 2011-03-23 NOTE — Progress Notes (Signed)
Addended by: Lelon Perla on: 03/23/2011 11:11 AM   Modules accepted: Orders

## 2011-03-23 NOTE — Patient Instructions (Signed)

## 2011-03-25 ENCOUNTER — Telehealth: Payer: Self-pay | Admitting: *Deleted

## 2011-03-25 ENCOUNTER — Encounter: Payer: Self-pay | Admitting: *Deleted

## 2011-03-25 NOTE — Telephone Encounter (Signed)
Message copied by Verdene Rio on Wed Mar 25, 2011  4:41 PM ------      Message from: Lelon Perla      Created: Tue Mar 24, 2011 12:09 PM       Cholesterol--- LDL goal < 100,  HDL >50,  TG < 150.  Diet and exercise will increase HDL and decrease LDL and TG.  Fish,  Fish Oil, Flaxseed oil will also help increase the HDL and decrease Triglycerides.   Recheck labs in 3 months.------start Lipitor 10 mg #30  1 po qhs  , 2 refills  272.4  Lipid, hep      K low---  Take banana or oj daily---  Recheck 3 months    Bmp  401.9

## 2011-03-25 NOTE — Telephone Encounter (Signed)
Pt states that she is willing to work on diet, exercise and start either flaxseed or fish oil but decline to start the Lipitor.

## 2011-04-02 NOTE — Telephone Encounter (Signed)
Did you want to do this now or in 3 mos? Please advise    KP

## 2011-04-02 NOTE — Telephone Encounter (Signed)
Instead of lipid and hep--- we need to do HDL diagnostic-----I'll need to fill out order sheet-- pt will need ov 2-3 weeks after labs are drawn

## 2011-04-03 NOTE — Telephone Encounter (Signed)
Discussed with patient and she agreed to repeat labs in 3 months     KP

## 2011-04-03 NOTE — Telephone Encounter (Signed)
In 3 months

## 2011-11-02 ENCOUNTER — Encounter: Payer: Self-pay | Admitting: Family Medicine

## 2011-11-02 ENCOUNTER — Ambulatory Visit (INDEPENDENT_AMBULATORY_CARE_PROVIDER_SITE_OTHER): Payer: BC Managed Care – PPO | Admitting: Family Medicine

## 2011-11-02 VITALS — BP 116/76 | HR 87 | Temp 98.8°F | Wt 173.6 lb

## 2011-11-02 DIAGNOSIS — J111 Influenza due to unidentified influenza virus with other respiratory manifestations: Secondary | ICD-10-CM

## 2011-11-02 DIAGNOSIS — J329 Chronic sinusitis, unspecified: Secondary | ICD-10-CM

## 2011-11-02 DIAGNOSIS — J101 Influenza due to other identified influenza virus with other respiratory manifestations: Secondary | ICD-10-CM

## 2011-11-02 DIAGNOSIS — R609 Edema, unspecified: Secondary | ICD-10-CM

## 2011-11-02 DIAGNOSIS — R05 Cough: Secondary | ICD-10-CM

## 2011-11-02 MED ORDER — OSELTAMIVIR PHOSPHATE 75 MG PO CAPS: 75.0000 mg | ORAL_CAPSULE | Freq: Two times a day (BID) | ORAL | Status: DC

## 2011-11-02 MED ORDER — GUAIFENESIN-CODEINE 100-10 MG/5ML PO SYRP: ORAL_SOLUTION | ORAL | Status: DC

## 2011-11-02 MED ORDER — BECLOMETHASONE DIPROPIONATE 80 MCG/ACT NA AERS: 2.0000 | INHALATION_SPRAY | Freq: Every day | NASAL | Status: DC

## 2011-11-02 MED ORDER — HYDROCHLOROTHIAZIDE 25 MG PO TABS: 25.0000 mg | ORAL_TABLET | Freq: Every day | ORAL | Status: DC

## 2011-11-02 MED ORDER — CEFUROXIME AXETIL 500 MG PO TABS: 500.0000 mg | ORAL_TABLET | Freq: Two times a day (BID) | ORAL | Status: AC

## 2011-11-02 NOTE — Progress Notes (Signed)
  Subjective:     Lisa Lawson is a 51 y.o. female who presents for evaluation of symptoms of a URI. Symptoms include achiness, congestion, facial pain, nasal congestion, productive cough with  green colored sputum and sinus pressure. Onset of symptoms was 4 days ago, and has been gradually worsening since that time. Treatment to date: none.  The following portions of the patient's history were reviewed and updated as appropriate: allergies, current medications, past family history, past medical history, past social history, past surgical history and problem list.  Review of Systems Pertinent items are noted in HPI.   Objective:    BP 116/76  Pulse 87  Temp(Src) 98.8 F (37.1 C) (Oral)  Wt 173 lb 9.6 oz (78.744 kg)  SpO2 97% General appearance: alert, cooperative, appears stated age and mild distress Ears: normal TM's and external ear canals both ears Nose: clear discharge, mild congestion, turbinates red, swollen, sinus tenderness bilateral Throat: lips, mucosa, and tongue normal; teeth and gums normal Neck: no adenopathy and thyroid not enlarged, symmetric, no tenderness/mass/nodules Lungs: clear to auscultation bilaterally and normal percussion bilaterally Heart: S1, S2 normal   Assessment:    influenza  with respiratory symptoms  Plan:  tamilfu  Discussed diagnosis and treatment of URI. Suggested symptomatic OTC remedies. Nasal saline spray for congestion. Follow up as needed.

## 2011-11-02 NOTE — Patient Instructions (Signed)
Influenza Facts Flu (influenza) is a contagious respiratory illness caused by the influenza viruses. It can cause mild to severe illness. While most healthy people recover from the flu without specific treatment and without complications, older people, young children, and people with certain health conditions are at higher risk for serious complications from the flu, including death. CAUSES   The flu virus is spread from person to person by respiratory droplets from coughing and sneezing.   A person can also become infected by touching an object or surface with a virus on it and then touching their mouth, eye or nose.   Adults may be able to infect others from 1 day before symptoms occur and up to 7 days after getting sick. So it is possible to give someone the flu even before you know you are sick and continue to infect others while you are sick.  SYMPTOMS   Fever (usually high).   Headache.   Tiredness (can be extreme).   Cough.   Sore throat.   Runny or stuffy nose.   Body aches.   Diarrhea and vomiting may also occur, particularly in children.   These symptoms are referred to as "flu-like symptoms". A lot of different illnesses, including the common cold, can have similar symptoms.  DIAGNOSIS   There are tests that can determine if you have the flu as long you are tested within the first 2 or 3 days of illness.   A doctor's exam and additional tests may be needed to identify if you have a disease that is a complicating the flu.  RISKS AND COMPLICATIONS  Some of the complications caused by the flu include:  Bacterial pneumonia or progressive pneumonia caused by the flu virus.   Loss of body fluids (dehydration).   Worsening of chronic medical conditions, such as heart failure, asthma, or diabetes.   Sinus problems and ear infections.  HOME CARE INSTRUCTIONS   Seek medical care early on.   If you are at high risk from complications of the flu, consult your health-care  provider as soon as you develop flu-like symptoms. Those at high risk for complications include:   People 65 years or older.   People with chronic medical conditions, including diabetes.   Pregnant women.   Young children.   Your caregiver may recommend use of an antiviral medication to help treat the flu.   If you get the flu, get plenty of rest, drink a lot of liquids, and avoid using alcohol and tobacco.   You can take over-the-counter medications to relieve the symptoms of the flu if your caregiver approves. (Never give aspirin to children or teenagers who have flu-like symptoms, particularly fever).  PREVENTION  The single best way to prevent the flu is to get a flu vaccine each fall. Other measures that can help protect against the flu are:  Antiviral Medications   A number of antiviral drugs are approved for use in preventing the flu. These are prescription medications, and a doctor should be consulted before they are used.   Habits for Good Health   Cover your nose and mouth with a tissue when you cough or sneeze, throw the tissue away after you use it.   Wash your hands often with soap and water, especially after you cough or sneeze. If you are not near water, use an alcohol-based hand cleaner.   Avoid people who are sick.   If you get the flu, stay home from work or school. Avoid contact with   other people so that you do not make them sick, too.   Try not to touch your eyes, nose, or mouth as germs ore often spread this way.  IN CHILDREN, EMERGENCY WARNING SIGNS THAT NEED URGENT MEDICAL ATTENTION:  Fast breathing or trouble breathing.   Bluish skin color.   Not drinking enough fluids.   Not waking up or not interacting.   Being so irritable that the child does not want to be held.   Flu-like symptoms improve but then return with fever and worse cough.   Fever with a rash.  IN ADULTS, EMERGENCY WARNING SIGNS THAT NEED URGENT MEDICAL ATTENTION:  Difficulty  breathing or shortness of breath.   Pain or pressure in the chest or abdomen.   Sudden dizziness.   Confusion.   Severe or persistent vomiting.  SEEK IMMEDIATE MEDICAL CARE IF:  You or someone you know is experiencing any of the symptoms above. When you arrive at the emergency center,report that you think you have the flu. You may be asked to wear a mask and/or sit in a secluded area to protect others from getting sick. MAKE SURE YOU:   Understand these instructions.   Monitor your condition.   Seek medical care if you are getting worse, or not improving.  Document Released: 08/20/2003 Document Revised: 08/06/2011 Document Reviewed: 05/16/2009 [employer] Patient Information 2012 [employer]. 

## 2011-11-03 ENCOUNTER — Ambulatory Visit: Payer: BC Managed Care – PPO | Admitting: Family Medicine

## 2012-05-12 ENCOUNTER — Telehealth: Payer: Self-pay | Admitting: Family Medicine

## 2012-05-12 DIAGNOSIS — R609 Edema, unspecified: Secondary | ICD-10-CM

## 2012-05-12 MED ORDER — HYDROCHLOROTHIAZIDE 25 MG PO TABS: ORAL_TABLET | ORAL | Status: DC

## 2012-05-12 NOTE — Telephone Encounter (Signed)
Refill: Hydrochlorothiazide 25mg  tab. Take 1 tablet by mouth daily. Qty 100. Last fill 01-30-12

## 2012-06-27 ENCOUNTER — Ambulatory Visit (INDEPENDENT_AMBULATORY_CARE_PROVIDER_SITE_OTHER): Payer: BC Managed Care – PPO | Admitting: Family Medicine

## 2012-06-27 ENCOUNTER — Encounter: Payer: Self-pay | Admitting: Family Medicine

## 2012-06-27 VITALS — BP 110/72 | HR 99 | Temp 98.4°F | Wt 175.2 lb

## 2012-06-27 DIAGNOSIS — N390 Urinary tract infection, site not specified: Secondary | ICD-10-CM

## 2012-06-27 DIAGNOSIS — Z0000 Encounter for general adult medical examination without abnormal findings: Secondary | ICD-10-CM

## 2012-06-27 DIAGNOSIS — Z7189 Other specified counseling: Secondary | ICD-10-CM

## 2012-06-27 DIAGNOSIS — E538 Deficiency of other specified B group vitamins: Secondary | ICD-10-CM

## 2012-06-27 DIAGNOSIS — E785 Hyperlipidemia, unspecified: Secondary | ICD-10-CM

## 2012-06-27 DIAGNOSIS — D518 Other vitamin B12 deficiency anemias: Secondary | ICD-10-CM

## 2012-06-27 DIAGNOSIS — N951 Menopausal and female climacteric states: Secondary | ICD-10-CM

## 2012-06-27 DIAGNOSIS — Z720 Tobacco use: Secondary | ICD-10-CM | POA: Insufficient documentation

## 2012-06-27 DIAGNOSIS — F17200 Nicotine dependence, unspecified, uncomplicated: Secondary | ICD-10-CM

## 2012-06-27 DIAGNOSIS — Z716 Tobacco abuse counseling: Secondary | ICD-10-CM

## 2012-06-27 LAB — BASIC METABOLIC PANEL
BUN: 17 mg/dL (ref 6–23)
CO2: 29 mEq/L (ref 19–32)
Chloride: 102 mEq/L (ref 96–112)
Creatinine, Ser: 0.8 mg/dL (ref 0.4–1.2)
Glucose, Bld: 91 mg/dL (ref 70–99)
Potassium: 3.3 mEq/L — ABNORMAL LOW (ref 3.5–5.1)

## 2012-06-27 LAB — HEPATIC FUNCTION PANEL
ALT: 12 U/L (ref 0–35)
Bilirubin, Direct: 0 mg/dL (ref 0.0–0.3)
Total Protein: 7.3 g/dL (ref 6.0–8.3)

## 2012-06-27 LAB — CBC WITH DIFFERENTIAL/PLATELET
Basophils Relative: 0.3 % (ref 0.0–3.0)
Eosinophils Absolute: 0.1 10*3/uL (ref 0.0–0.7)
Eosinophils Relative: 1.6 % (ref 0.0–5.0)
HCT: 38.3 % (ref 36.0–46.0)
Lymphs Abs: 2.3 10*3/uL (ref 0.7–4.0)
MCHC: 32.1 g/dL (ref 30.0–36.0)
MCV: 83.5 fl (ref 78.0–100.0)
Monocytes Absolute: 0.6 10*3/uL (ref 0.1–1.0)
Neutro Abs: 5.9 10*3/uL (ref 1.4–7.7)
Neutrophils Relative %: 66.1 % (ref 43.0–77.0)
Platelets: 270 10*3/uL (ref 150.0–400.0)
RBC: 4.58 Mil/uL (ref 3.87–5.11)
RDW: 13.4 % (ref 11.5–14.6)

## 2012-06-27 LAB — POCT URINALYSIS DIPSTICK
Bilirubin, UA: NEGATIVE
Blood, UA: NEGATIVE
Ketones, UA: NEGATIVE
Spec Grav, UA: >=1.03
pH, UA: 6.5

## 2012-06-27 LAB — LIPID PANEL
Cholesterol: 202 mg/dL — ABNORMAL HIGH (ref 0–200)
HDL: 46.1 mg/dL (ref >39.00)
Triglycerides: 61 mg/dL (ref 0.0–149.0)

## 2012-06-27 LAB — TSH: TSH: 1.01 u[IU]/mL (ref 0.35–5.50)

## 2012-06-27 MED ORDER — ASPIRIN EC 81 MG PO TBEC: 81.0000 mg | DELAYED_RELEASE_TABLET | Freq: Every day | ORAL | Status: DC

## 2012-06-27 MED ORDER — NICOTINE 10 MG IN INHA: 1.0000 | RESPIRATORY_TRACT | Status: DC | PRN

## 2012-06-27 NOTE — Assessment & Plan Note (Signed)
Nicotrol inhaler sample given to pt And see AVS

## 2012-06-27 NOTE — Progress Notes (Signed)
  Subjective:    Patient ID: Lisa Lawson, female    DOB: 09-23-1960, 51 y.o.   MRN: [id number]  HPI Pt here for f/u and labs.  She will schedule cpe for later date.  Pt is having hot flashes at night.  She has tried otc meds. Pt is also still smoking but is willing to try something to quit.   No other complaints.    Review of Systems    no complaints Objective:   Physical Exam  Constitutional: She is oriented to person, place, and time. She appears well-developed and well-nourished.  Pulmonary/Chest: Effort normal.  Musculoskeletal: Normal range of motion. She exhibits no edema.  Neurological: She is alert and oriented to person, place, and time.  Psychiatric: She has a normal mood and affect. Her behavior is normal. Judgment and thought content normal.          Assessment & Plan:

## 2012-06-27 NOTE — Assessment & Plan Note (Signed)
Check labs 

## 2012-06-27 NOTE — Addendum Note (Signed)
Addended by: Silvio Pate D on: 06/27/2012 02:00 PM   Modules accepted: Orders

## 2012-06-27 NOTE — Patient Instructions (Addendum)

## 2012-06-27 NOTE — Assessment & Plan Note (Signed)
D/w pt that she would need both estrogen and progesterone --- she will wait

## 2012-07-13 ENCOUNTER — Telehealth: Payer: Self-pay

## 2012-07-13 MED ORDER — POTASSIUM CHLORIDE CRYS ER 20 MEQ PO TBCR: 20.0000 meq | EXTENDED_RELEASE_TABLET | Freq: Every day | ORAL | Status: DC

## 2012-07-13 NOTE — Addendum Note (Signed)
Addended by: Arnette Norris on: 07/13/2012 03:36 PM   Modules accepted: Orders

## 2012-07-13 NOTE — Telephone Encounter (Signed)
Cholesterol--- LDL goal < 100, HDL >40, TG < 150. Diet and exercise will increase HDL and decrease LDL and TG. Fish, Fish Oil, Flaxseed oil will also help increase the HDL and decrease Triglycerides. Recheck labs in 3 months------ Start lipitor 10 mg #30 1 po qhs, 2 refills K is low--- Eat K rich foods0-- If already doing that take kcl 20 meq #30 1 po qd , 2 refills 272.4 401.9 Lipid, hep, bmp

## 2012-07-13 NOTE — Telephone Encounter (Signed)
Dr.Lowne this patient wanted me to make you aware that she had her mammogram done at [facility] on [address] and this is where she wanted to have her BMD. She will have the results of the mammogram sent to [place].     KP

## 2012-07-13 NOTE — Telephone Encounter (Signed)
Discussed with patient and she wanted to know when she had her last Tdap. I made the patient aware and also got the lot number out of the paper chart and added it to the system. Patient will call back if she needs a copy mailed to her home address.   KP

## 2012-07-13 NOTE — Telephone Encounter (Signed)
Pt called [facility] triage stating had questions about previous appt. ( no more details provided)  Plz advise pt        MW [phone]

## 2012-07-26 ENCOUNTER — Encounter: Payer: BC Managed Care – PPO | Admitting: Family Medicine

## 2012-10-17 ENCOUNTER — Telehealth: Payer: Self-pay | Admitting: Family Medicine

## 2012-10-17 MED ORDER — POTASSIUM CHLORIDE CRYS ER 20 MEQ PO TBCR: EXTENDED_RELEASE_TABLET | ORAL | Status: DC

## 2012-10-17 NOTE — Telephone Encounter (Signed)
Refill: Klor-con m20 tablet. 90 day supply

## 2012-11-05 ENCOUNTER — Telehealth: Payer: Self-pay | Admitting: Family Medicine

## 2013-05-16 ENCOUNTER — Telehealth: Payer: Self-pay | Admitting: Family Medicine

## 2013-05-16 DIAGNOSIS — E2839 Other primary ovarian failure: Secondary | ICD-10-CM

## 2013-05-16 DIAGNOSIS — Z780 Asymptomatic menopausal state: Secondary | ICD-10-CM

## 2013-05-16 NOTE — Telephone Encounter (Signed)
Please advise if it is okay to send the order     KP

## 2013-05-16 NOTE — Telephone Encounter (Signed)
That is fine 

## 2013-05-16 NOTE — Telephone Encounter (Signed)
Patient states that she needs an order for a Bone density sent to [facility]. She is having her mammogram (which her OBGYN ordered) with them on 06/20/13 and wants to have her bone density during this same visit. Fax#: [phone]

## 2013-05-17 ENCOUNTER — Telehealth: Payer: Self-pay

## 2013-05-17 NOTE — Telephone Encounter (Signed)
error 

## 2013-06-20 LAB — HM MAMMOGRAPHY: HM Mammogram: NEGATIVE

## 2013-06-23 NOTE — Telephone Encounter (Signed)
Opened in error

## 2013-07-03 ENCOUNTER — Telehealth: Payer: Self-pay

## 2013-07-03 NOTE — Telephone Encounter (Signed)
Left message for call back identifiable  [facility]S? Pap--03/2011--neg MMG-05/2013--Neg Tdap--01/2006

## 2013-07-04 ENCOUNTER — Encounter: Payer: Self-pay | Admitting: Family Medicine

## 2013-07-04 ENCOUNTER — Ambulatory Visit (INDEPENDENT_AMBULATORY_CARE_PROVIDER_SITE_OTHER): Payer: BC Managed Care – PPO | Admitting: Family Medicine

## 2013-07-04 VITALS — BP 122/80 | HR 94 | Temp 97.9°F | Ht 62.0 in | Wt 184.2 lb

## 2013-07-04 DIAGNOSIS — R609 Edema, unspecified: Secondary | ICD-10-CM

## 2013-07-04 DIAGNOSIS — E785 Hyperlipidemia, unspecified: Secondary | ICD-10-CM

## 2013-07-04 DIAGNOSIS — Z0000 Encounter for general adult medical examination without abnormal findings: Secondary | ICD-10-CM

## 2013-07-04 DIAGNOSIS — J069 Acute upper respiratory infection, unspecified: Secondary | ICD-10-CM

## 2013-07-04 DIAGNOSIS — E669 Obesity, unspecified: Secondary | ICD-10-CM | POA: Insufficient documentation

## 2013-07-04 DIAGNOSIS — E876 Hypokalemia: Secondary | ICD-10-CM

## 2013-07-04 LAB — BASIC METABOLIC PANEL
BUN: 9 mg/dL (ref 6–23)
CO2: 28 mEq/L (ref 19–32)
Calcium: 8.9 mg/dL (ref 8.4–10.5)
Creatinine, Ser: 0.8 mg/dL (ref 0.4–1.2)
Glucose, Bld: 91 mg/dL (ref 70–99)

## 2013-07-04 LAB — CBC WITH DIFFERENTIAL/PLATELET
Basophils Absolute: 0.1 10*3/uL (ref 0.0–0.1)
Eosinophils Absolute: 0.2 10*3/uL (ref 0.0–0.7)
Lymphocytes Relative: 25.5 % (ref 12.0–46.0)
MCHC: 33.2 g/dL (ref 30.0–36.0)
Monocytes Absolute: 0.5 10*3/uL (ref 0.1–1.0)
Neutrophils Relative %: 65.3 % (ref 43.0–77.0)
Platelets: 283 10*3/uL (ref 150.0–400.0)
RDW: 13.4 % (ref 11.5–14.6)

## 2013-07-04 LAB — HEPATIC FUNCTION PANEL
Albumin: 4.1 g/dL (ref 3.5–5.2)
Alkaline Phosphatase: 91 U/L (ref 39–117)
Bilirubin, Direct: 0 mg/dL (ref 0.0–0.3)

## 2013-07-04 LAB — LIPID PANEL
HDL: 47.8 mg/dL (ref >39.00)
Triglycerides: 84 mg/dL (ref 0.0–149.0)
VLDL: 16.8 mg/dL (ref 0.0–40.0)

## 2013-07-04 LAB — TSH: TSH: 0.81 u[IU]/mL (ref 0.35–5.50)

## 2013-07-04 MED ORDER — POTASSIUM CHLORIDE CRYS ER 20 MEQ PO TBCR: EXTENDED_RELEASE_TABLET | ORAL | Status: DC

## 2013-07-04 MED ORDER — HYDROCHLOROTHIAZIDE 25 MG PO TABS: ORAL_TABLET | ORAL | Status: DC

## 2013-07-04 NOTE — Patient Instructions (Signed)
Preventive Care for Adults, Female A healthy lifestyle and preventive care can promote health and wellness. Preventive health guidelines for women include the following key practices.  A routine yearly physical is a good way to check with your caregiver about your health and preventive screening. It is a chance to share any concerns and updates on your health, and to receive a thorough exam.  Visit your dentist for a routine exam and preventive care every 6 months. Brush your teeth twice a day and floss once a day. Good oral hygiene prevents tooth decay and gum disease.  The frequency of eye exams is based on your age, health, family medical history, use of contact lenses, and other factors. Follow your caregiver's recommendations for frequency of eye exams.  Eat a healthy diet. Foods like vegetables, fruits, whole grains, low-fat dairy products, and lean protein foods contain the nutrients you need without too many calories. Decrease your intake of foods high in solid fats, added sugars, and salt. Eat the right amount of calories for you.Get information about a proper diet from your caregiver, if necessary.  Regular physical exercise is one of the most important things you can do for your health. Most adults should get at least 150 minutes of moderate-intensity exercise (any activity that increases your heart rate and causes you to sweat) each week. In addition, most adults need muscle-strengthening exercises on 2 or more days a week.  Maintain a healthy weight. The body mass index (BMI) is a screening tool to identify possible weight problems. It provides an estimate of body fat based on height and weight. Your caregiver can help determine your BMI, and can help you achieve or maintain a healthy weight.For adults 20 years and older:  A BMI below 18.5 is considered underweight.  A BMI of 18.5 to 24.9 is normal.  A BMI of 25 to 29.9 is considered overweight.  A BMI of 30 and above is  considered obese.  Maintain normal blood lipids and cholesterol levels by exercising and minimizing your intake of saturated fat. Eat a balanced diet with plenty of fruit and vegetables. Blood tests for lipids and cholesterol should begin at age 20 and be repeated every 5 years. If your lipid or cholesterol levels are high, you are over 50, or you are at high risk for heart disease, you may need your cholesterol levels checked more frequently.Ongoing high lipid and cholesterol levels should be treated with medicines if diet and exercise are not effective.  If you smoke, find out from your caregiver how to quit. If you do not use tobacco, do not start.  If you are pregnant, do not drink alcohol. If you are breastfeeding, be very cautious about drinking alcohol. If you are not pregnant and choose to drink alcohol, do not exceed 1 drink per day. One drink is considered to be 12 ounces (355 mL) of beer, 5 ounces (148 mL) of wine, or 1.5 ounces (44 mL) of liquor.  Avoid use of street drugs. Do not share needles with anyone. Ask for help if you need support or instructions about stopping the use of drugs.  High blood pressure causes heart disease and increases the risk of stroke. Your blood pressure should be checked at least every 1 to 2 years. Ongoing high blood pressure should be treated with medicines if weight loss and exercise are not effective.  If you are 55 to 52 years old, ask your caregiver if you should take aspirin to prevent strokes.  Diabetes   screening involves taking a blood sample to check your fasting blood sugar level. This should be done once every 3 years, after age 45, if you are within normal weight and without risk factors for diabetes. Testing should be considered at a younger age or be carried out more frequently if you are overweight and have at least 1 risk factor for diabetes.  Breast cancer screening is essential preventive care for women. You should practice "breast  self-awareness." This means understanding the normal appearance and feel of your breasts and may include breast self-examination. Any changes detected, no matter how small, should be reported to a caregiver. Women in their 20s and 30s should have a clinical breast exam (CBE) by a caregiver as part of a regular health exam every 1 to 3 years. After age 40, women should have a CBE every year. Starting at age 40, women should consider having a mammography (breast X-ray test) every year. Women who have a family history of breast cancer should talk to their caregiver about genetic screening. Women at a high risk of breast cancer should talk to their caregivers about having magnetic resonance imaging (MRI) and a mammography every year.  The Pap test is a screening test for cervical cancer. A Pap test can show cell changes on the cervix that might become cervical cancer if left untreated. A Pap test is a procedure in which cells are obtained and examined from the lower end of the uterus (cervix).  Women should have a Pap test starting at age 21.  Between ages 21 and 29, Pap tests should be repeated every 2 years.  Beginning at age 30, you should have a Pap test every 3 years as long as the past 3 Pap tests have been normal.  Some women have medical problems that increase the chance of getting cervical cancer. Talk to your caregiver about these problems. It is especially important to talk to your caregiver if a new problem develops soon after your last Pap test. In these cases, your caregiver may recommend more frequent screening and Pap tests.  The above recommendations are the same for women who have or have not gotten the vaccine for human papillomavirus (HPV).  If you had a hysterectomy for a problem that was not cancer or a condition that could lead to cancer, then you no longer need Pap tests. Even if you no longer need a Pap test, a regular exam is a good idea to make sure no other problems are  starting.  If you are between ages 65 and 70, and you have had normal Pap tests going back 10 years, you no longer need Pap tests. Even if you no longer need a Pap test, a regular exam is a good idea to make sure no other problems are starting.  If you have had past treatment for cervical cancer or a condition that could lead to cancer, you need Pap tests and screening for cancer for at least 20 years after your treatment.  If Pap tests have been discontinued, risk factors (such as a new sexual partner) need to be reassessed to determine if screening should be resumed.  The HPV test is an additional test that may be used for cervical cancer screening. The HPV test looks for the virus that can cause the cell changes on the cervix. The cells collected during the Pap test can be tested for HPV. The HPV test could be used to screen women aged 30 years and older, and should   be used in women of any age who have unclear Pap test results. After the age of 30, women should have HPV testing at the same frequency as a Pap test.  Colorectal cancer can be detected and often prevented. Most routine colorectal cancer screening begins at the age of 50 and continues through age 75. However, your caregiver may recommend screening at an earlier age if you have risk factors for colon cancer. On a yearly basis, your caregiver may provide home test kits to check for hidden blood in the stool. Use of a small camera at the end of a tube, to directly examine the colon (sigmoidoscopy or colonoscopy), can detect the earliest forms of colorectal cancer. Talk to your caregiver about this at age 50, when routine screening begins. Direct examination of the colon should be repeated every 5 to 10 years through age 75, unless early forms of pre-cancerous polyps or small growths are found.  Hepatitis C blood testing is recommended for all people born from 1945 through 1965 and any individual with known risks for hepatitis C.  Practice  safe sex. Use condoms and avoid high-risk sexual practices to reduce the spread of sexually transmitted infections (STIs). STIs include gonorrhea, chlamydia, syphilis, trichomonas, herpes, HPV, and human immunodeficiency virus (HIV). Herpes, HIV, and HPV are viral illnesses that have no cure. They can result in disability, cancer, and death. Sexually active women aged 25 and younger should be checked for chlamydia. Older women with new or multiple partners should also be tested for chlamydia. Testing for other STIs is recommended if you are sexually active and at increased risk.  Osteoporosis is a disease in which the bones lose minerals and strength with aging. This can result in serious bone fractures. The risk of osteoporosis can be identified using a bone density scan. Women ages 65 and over and women at risk for fractures or osteoporosis should discuss screening with their caregivers. Ask your caregiver whether you should take a calcium supplement or vitamin D to reduce the rate of osteoporosis.  Menopause can be associated with physical symptoms and risks. Hormone replacement therapy is available to decrease symptoms and risks. You should talk to your caregiver about whether hormone replacement therapy is right for you.  Use sunscreen with sun protection factor (SPF) of 30 or more. Apply sunscreen liberally and repeatedly throughout the day. You should seek shade when your shadow is shorter than you. Protect yourself by wearing long sleeves, pants, a wide-brimmed hat, and sunglasses year round, whenever you are outdoors.  Once a month, do a whole body skin exam, using a mirror to look at the skin on your back. Notify your caregiver of new moles, moles that have irregular borders, moles that are larger than a pencil eraser, or moles that have changed in shape or color.  Stay current with required immunizations.  Influenza. You need a dose every fall (or winter). The composition of the flu vaccine  changes each year, so being vaccinated once is not enough.  Pneumococcal polysaccharide. You need 1 to 2 doses if you smoke cigarettes or if you have certain chronic medical conditions. You need 1 dose at age 65 (or older) if you have never been vaccinated.  Tetanus, diphtheria, pertussis (Tdap, Td). Get 1 dose of Tdap vaccine if you are younger than age 65, are over 65 and have contact with an infant, are a healthcare worker, are pregnant, or simply want to be protected from whooping cough. After that, you need a Td   booster dose every 10 years. Consult your caregiver if you have not had at least 3 tetanus and diphtheria-containing shots sometime in your life or have a deep or dirty wound.  HPV. You need this vaccine if you are a woman age 26 or younger. The vaccine is given in 3 doses over 6 months.  Measles, mumps, rubella (MMR). You need at least 1 dose of MMR if you were born in 1957 or later. You may also need a second dose.  Meningococcal. If you are age 19 to 21 and a first-year college student living in a residence hall, or have one of several medical conditions, you need to get vaccinated against meningococcal disease. You may also need additional booster doses.  Zoster (shingles). If you are age 60 or older, you should get this vaccine.  Varicella (chickenpox). If you have never had chickenpox or you were vaccinated but received only 1 dose, talk to your caregiver to find out if you need this vaccine.  Hepatitis A. You need this vaccine if you have a specific risk factor for hepatitis A virus infection or you simply wish to be protected from this disease. The vaccine is usually given as 2 doses, 6 to 18 months apart.  Hepatitis B. You need this vaccine if you have a specific risk factor for hepatitis B virus infection or you simply wish to be protected from this disease. The vaccine is given in 3 doses, usually over 6 months. Preventive Services / Frequency Ages 19 to 39  Blood  pressure check.** / Every 1 to 2 years.  Lipid and cholesterol check.** / Every 5 years beginning at age 20.  Clinical breast exam.** / Every 3 years for women in their 20s and 30s.  Pap test.** / Every 2 years from ages 21 through 29. Every 3 years starting at age 30 through age 65 or 70 with a history of 3 consecutive normal Pap tests.  HPV screening.** / Every 3 years from ages 30 through ages 65 to 70 with a history of 3 consecutive normal Pap tests.  Hepatitis C blood test.** / For any individual with known risks for hepatitis C.  Skin self-exam. / Monthly.  Influenza immunization.** / Every year.  Pneumococcal polysaccharide immunization.** / 1 to 2 doses if you smoke cigarettes or if you have certain chronic medical conditions.  Tetanus, diphtheria, pertussis (Tdap, Td) immunization. / A one-time dose of Tdap vaccine. After that, you need a Td booster dose every 10 years.  HPV immunization. / 3 doses over 6 months, if you are 26 and younger.  Measles, mumps, rubella (MMR) immunization. / You need at least 1 dose of MMR if you were born in 1957 or later. You may also need a second dose.  Meningococcal immunization. / 1 dose if you are age 19 to 21 and a first-year college student living in a residence hall, or have one of several medical conditions, you need to get vaccinated against meningococcal disease. You may also need additional booster doses.  Varicella immunization.** / Consult your caregiver.  Hepatitis A immunization.** / Consult your caregiver. 2 doses, 6 to 18 months apart.  Hepatitis B immunization.** / Consult your caregiver. 3 doses usually over 6 months. Ages 40 to 64  Blood pressure check.** / Every 1 to 2 years.  Lipid and cholesterol check.** / Every 5 years beginning at age 20.  Clinical breast exam.** / Every year after age 40.  Mammogram.** / Every year beginning at age 40   and continuing for as long as you are in good health. Consult with your  caregiver.  Pap test.** / Every 3 years starting at age 30 through age 65 or 70 with a history of 3 consecutive normal Pap tests.  HPV screening.** / Every 3 years from ages 30 through ages 65 to 70 with a history of 3 consecutive normal Pap tests.  Fecal occult blood test (FOBT) of stool. / Every year beginning at age 50 and continuing until age 75. You may not need to do this test if you get a colonoscopy every 10 years.  Flexible sigmoidoscopy or colonoscopy.** / Every 5 years for a flexible sigmoidoscopy or every 10 years for a colonoscopy beginning at age 50 and continuing until age 75.  Hepatitis C blood test.** / For all people born from 1945 through 1965 and any individual with known risks for hepatitis C.  Skin self-exam. / Monthly.  Influenza immunization.** / Every year.  Pneumococcal polysaccharide immunization.** / 1 to 2 doses if you smoke cigarettes or if you have certain chronic medical conditions.  Tetanus, diphtheria, pertussis (Tdap, Td) immunization.** / A one-time dose of Tdap vaccine. After that, you need a Td booster dose every 10 years.  Measles, mumps, rubella (MMR) immunization. / You need at least 1 dose of MMR if you were born in 1957 or later. You may also need a second dose.  Varicella immunization.** / Consult your caregiver.  Meningococcal immunization.** / Consult your caregiver.  Hepatitis A immunization.** / Consult your caregiver. 2 doses, 6 to 18 months apart.  Hepatitis B immunization.** / Consult your caregiver. 3 doses, usually over 6 months. Ages 65 and over  Blood pressure check.** / Every 1 to 2 years.  Lipid and cholesterol check.** / Every 5 years beginning at age 20.  Clinical breast exam.** / Every year after age 40.  Mammogram.** / Every year beginning at age 40 and continuing for as long as you are in good health. Consult with your caregiver.  Pap test.** / Every 3 years starting at age 30 through age 65 or 70 with a 3  consecutive normal Pap tests. Testing can be stopped between 65 and 70 with 3 consecutive normal Pap tests and no abnormal Pap or HPV tests in the past 10 years.  HPV screening.** / Every 3 years from ages 30 through ages 65 or 70 with a history of 3 consecutive normal Pap tests. Testing can be stopped between 65 and 70 with 3 consecutive normal Pap tests and no abnormal Pap or HPV tests in the past 10 years.  Fecal occult blood test (FOBT) of stool. / Every year beginning at age 50 and continuing until age 75. You may not need to do this test if you get a colonoscopy every 10 years.  Flexible sigmoidoscopy or colonoscopy.** / Every 5 years for a flexible sigmoidoscopy or every 10 years for a colonoscopy beginning at age 50 and continuing until age 75.  Hepatitis C blood test.** / For all people born from 1945 through 1965 and any individual with known risks for hepatitis C.  Osteoporosis screening.** / A one-time screening for women ages 65 and over and women at risk for fractures or osteoporosis.  Skin self-exam. / Monthly.  Influenza immunization.** / Every year.  Pneumococcal polysaccharide immunization.** / 1 dose at age 65 (or older) if you have never been vaccinated.  Tetanus, diphtheria, pertussis (Tdap, Td) immunization. / A one-time dose of Tdap vaccine if you are over   65 and have contact with an infant, are a healthcare worker, or simply want to be protected from whooping cough. After that, you need a Td booster dose every 10 years.  Varicella immunization.** / Consult your caregiver.  Meningococcal immunization.** / Consult your caregiver.  Hepatitis A immunization.** / Consult your caregiver. 2 doses, 6 to 18 months apart.  Hepatitis B immunization.** / Check with your caregiver. 3 doses, usually over 6 months. ** Family history and personal history of risk and conditions may change your caregiver's recommendations. Document Released: 10/13/2001 Document Revised: 11/09/2011  Document Reviewed: 01/12/2011 [employer] Patient Information 2014 [employer].  

## 2013-07-04 NOTE — Progress Notes (Signed)
Subjective:     Lisa Lawson is a 52 y.o. female and is here for a comprehensive physical exam. The patient reports no problems.  History   Social History  . Marital Status: Single    Spouse Name: N/A    Number of Children: N/A  . Years of Education: N/A   Occupational History  . Not on file.   Social History Main Topics  . Smoking status: Current Every Day Smoker -- 0.80 packs/day for 30 years    Types: Cigarettes  . Smokeless tobacco: Never Used  . Alcohol Use: Yes     Comment: rare  . Drug Use: No  . Sexual Activity: Yes    Partners: Male   Other Topics Concern  . Not on file   Social History Narrative  . No narrative on file   Health Maintenance  Topic Date Due  . Colonoscopy  07/21/2011  . Pap Smear  03/22/2014  . Influenza Vaccine  03/31/2014  . Mammogram  06/21/2015  . Tetanus/tdap  02/11/2016    The following portions of the patient's history were reviewed and updated as appropriate:  She  has a past medical history of Hyperlipidemia. She  does not have any pertinent problems on file. She  has past surgical history that includes Cesarean section and Breast biopsy. Her family history includes Cancer in her father; Diabetes in her mother; Hypertension in her mother; Lung cancer in her father. She  reports that she has been smoking Cigarettes.  She has a 24 pack-year smoking history. She has never used smokeless tobacco. She reports that she drinks alcohol. She reports that she does not use illicit drugs. She has a current medication list which includes the following prescription(s): vitamin c, aspirin ec, hydrochlorothiazide, vitamin b-12, beclomethasone dipropionate, guaifenesin-codeine, nicotine, oseltamivir, potassium chloride sa, and red yeast rice. Current Outpatient Prescriptions on File Prior to Visit  Medication Sig Dispense Refill  . Ascorbic Acid (VITAMIN C) 100 MG tablet Take 100 mg by mouth daily.        Marland Kitchen aspirin EC 81 MG tablet Take 1 tablet  (81 mg total) by mouth daily.      . vitamin B-12 (CYANOCOBALAMIN) 1000 MCG tablet Take 1,000 mcg by mouth daily.        . Beclomethasone Dipropionate (QNASL) 80 MCG/ACT AERS Place 2 sprays into the nose daily.      Marland Kitchen guaiFENesin-codeine (ROBITUSSIN AC) 100-10 MG/5ML syrup 1-2 tsp po qhs prn  120 mL  0  . nicotine (NICOTROL) 10 MG inhaler Inhale 1 puff into the lungs as needed for smoking cessation.  42 each  0  . oseltamivir (TAMIFLU) 75 MG capsule Take 1 capsule (75 mg total) by mouth 2 (two) times daily.  10 capsule  0  . Red Yeast Rice 600 MG TABS Take 1 tablet by mouth daily.         No current facility-administered medications on file prior to visit.   She has No Known Allergies..  Review of Systems Review of Systems  Constitutional: Negative for activity change, appetite change and fatigue.  HENT: Negative for hearing loss, congestion, tinnitus and ear discharge.  dentist q100m Eyes: Negative for visual disturbance (see optho q1y -- vision corrected to 20/20 with glasses).  Respiratory: Negative for cough, chest tightness and shortness of breath.   Cardiovascular: Negative for chest pain, palpitations and leg swelling.  Gastrointestinal: Negative for abdominal pain, diarrhea, constipation and abdominal distention.  Genitourinary: Negative for urgency, frequency, decreased urine  volume and difficulty urinating.  Musculoskeletal: Negative for back pain, arthralgias and gait problem.  Skin: Negative for color change, pallor and rash.  Neurological: Negative for dizziness, light-headedness, numbness and headaches.  Hematological: Negative for adenopathy. Does not bruise/bleed easily.  Psychiatric/Behavioral: Negative for suicidal ideas, confusion, sleep disturbance, self-injury, dysphoric mood, decreased concentration and agitation.       Objective:    BP 122/80  Pulse 94  Temp(Src) 97.9 F (36.6 C) (Oral)  Ht 5\' 2"  (1.575 m)  Wt 184 lb 3.2 oz (83.553 kg)  BMI 33.68 kg/m2   SpO2 96% General appearance: alert, cooperative, appears stated age and no distress Head: Normocephalic, without obvious abnormality, atraumatic Eyes: conjunctivae/corneas clear. PERRL, EOM's intact. Fundi benign. Ears: normal TM's and external ear canals both ears Nose: Nares normal. Septum midline. Mucosa normal. No drainage or sinus tenderness. Throat: abnormal findings: mild oropharyngeal erythema Neck: mild anterior cervical adenopathy, no adenopathy, no carotid bruit, no JVD, supple, symmetrical, trachea midline and thyroid not enlarged, symmetric, no tenderness/mass/nodules Back: symmetric, no curvature. ROM normal. No CVA tenderness. Lungs: clear to auscultation bilaterally Breasts: normal appearance, no masses or tenderness Heart: regular rate and rhythm, S1, S2 normal, no murmur, click, rub or gallop Abdomen: soft, non-tender; bowel sounds normal; no masses,  no organomegaly Pelvic: deferred Extremities: extremities normal, atraumatic, no cyanosis or edema Pulses: 2+ and symmetric Skin: Skin color, texture, turgor normal. No rashes or lesions Lymph nodes: Cervical, supraclavicular, and axillary nodes normal. Neurologic: Alert and oriented X 3, normal strength and tone. Normal symmetric reflexes. Normal coordination and gait Psych--no depression, anxiety      Assessment:    Healthy female exam.      Plan:    ghm utd Check labs See After Visit Summary for Counseling Recommendations

## 2013-07-04 NOTE — Telephone Encounter (Signed)
Unable to contact pre visit 

## 2013-07-04 NOTE — Assessment & Plan Note (Signed)
Check labs 

## 2013-07-04 NOTE — Assessment & Plan Note (Signed)
otc antihistamine  nsaid rto prn

## 2013-07-05 LAB — POCT URINALYSIS DIPSTICK
Protein, UA: NEGATIVE
Spec Grav, UA: <=1.005
Urobilinogen, UA: 0.2
pH, UA: 8

## 2013-08-03 ENCOUNTER — Encounter: Payer: Self-pay | Admitting: Family Medicine

## 2013-08-07 ENCOUNTER — Telehealth: Payer: Self-pay | Admitting: Family Medicine

## 2013-08-07 NOTE — Telephone Encounter (Signed)
Patient called regarding her referral to gastro

## 2013-08-14 NOTE — Telephone Encounter (Signed)
According to referral, GI contacted pt on 07/20/13 to schedule appt. They left message for patient to call back. I called pt and left msg for her to call our office so I may follow up with her.

## 2013-08-21 ENCOUNTER — Telehealth: Payer: Self-pay

## 2013-08-21 DIAGNOSIS — M8580 Other specified disorders of bone density and structure, unspecified site: Secondary | ICD-10-CM | POA: Insufficient documentation

## 2013-08-21 NOTE — Telephone Encounter (Signed)
Spoke with patient and made her aware that her BMD that was done on 07/17/13 showed Low Bone Mass or Osteopenia. Dr.Lowne recommends to take Calcium 1200-1500 mg daily and vitamin D 1000 units daily. That patient voiced understanding and has agreed to take the supplements, we discussed the possibility of adding Fosamax and she stated she would look up the information and get back to me. Copy of results sent to be scanned.         KP

## 2013-12-31 ENCOUNTER — Other Ambulatory Visit: Payer: Self-pay | Admitting: Family Medicine

## 2014-02-14 ENCOUNTER — Other Ambulatory Visit: Payer: Self-pay | Admitting: Family Medicine

## 2015-04-15 ENCOUNTER — Telehealth: Payer: Self-pay | Admitting: Behavioral Health

## 2015-04-15 NOTE — Telephone Encounter (Signed)
Unable to reach patient at time of Pre-Visit Call.  Left message for patient to return call when available.    

## 2015-04-16 ENCOUNTER — Ambulatory Visit (INDEPENDENT_AMBULATORY_CARE_PROVIDER_SITE_OTHER): Payer: BC Managed Care – PPO | Admitting: Family Medicine

## 2015-04-16 ENCOUNTER — Other Ambulatory Visit (HOSPITAL_COMMUNITY)
Admission: RE | Admit: 2015-04-16 | Discharge: 2015-04-16 | Disposition: A | Discharge status: Home / Self Care | Payer: BC Managed Care – PPO | Source: Ambulatory Visit | Attending: Family Medicine | Admitting: Family Medicine

## 2015-04-16 ENCOUNTER — Encounter: Payer: Self-pay | Admitting: Family Medicine

## 2015-04-16 VITALS — BP 114/74 | HR 96 | Temp 98.1°F | Resp 18 | Ht 62.0 in | Wt 167.6 lb

## 2015-04-16 DIAGNOSIS — Z0000 Encounter for general adult medical examination without abnormal findings: Secondary | ICD-10-CM | POA: Diagnosis not present

## 2015-04-16 DIAGNOSIS — M8580 Other specified disorders of bone density and structure, unspecified site: Secondary | ICD-10-CM

## 2015-04-16 DIAGNOSIS — Z01419 Encounter for gynecological examination (general) (routine) without abnormal findings: Secondary | ICD-10-CM | POA: Insufficient documentation

## 2015-04-16 DIAGNOSIS — Z124 Encounter for screening for malignant neoplasm of cervix: Secondary | ICD-10-CM

## 2015-04-16 DIAGNOSIS — D518 Other vitamin B12 deficiency anemias: Secondary | ICD-10-CM

## 2015-04-16 DIAGNOSIS — R252 Cramp and spasm: Secondary | ICD-10-CM | POA: Diagnosis not present

## 2015-04-16 DIAGNOSIS — N950 Postmenopausal bleeding: Secondary | ICD-10-CM | POA: Diagnosis not present

## 2015-04-16 DIAGNOSIS — E785 Hyperlipidemia, unspecified: Secondary | ICD-10-CM

## 2015-04-16 LAB — CBC WITH DIFFERENTIAL/PLATELET
BASOS ABS: 0 10*3/uL (ref 0.0–0.1)
Basophils Relative: 0.3 % (ref 0.0–3.0)
EOS ABS: 0.3 10*3/uL (ref 0.0–0.7)
Eosinophils Relative: 3.5 % (ref 0.0–5.0)
HCT: 40.7 % (ref 36.0–46.0)
Hemoglobin: 13.4 g/dL (ref 12.0–15.0)
Lymphocytes Relative: 21.8 % (ref 12.0–46.0)
Lymphs Abs: 1.9 10*3/uL (ref 0.7–4.0)
MCHC: 32.8 g/dL (ref 30.0–36.0)
MCV: 83.2 fl (ref 78.0–100.0)
MONOS PCT: 7.8 % (ref 3.0–12.0)
Monocytes Absolute: 0.7 10*3/uL (ref 0.1–1.0)
NEUTROS ABS: 5.8 10*3/uL (ref 1.4–7.7)
Neutrophils Relative %: 66.6 % (ref 43.0–77.0)
Platelets: 289 10*3/uL (ref 150.0–400.0)
RBC: 4.89 Mil/uL (ref 3.87–5.11)
RDW: 13.4 % (ref 11.5–15.5)
WBC: 8.7 10*3/uL (ref 4.0–10.5)

## 2015-04-16 LAB — LIPID PANEL
Cholesterol: 249 mg/dL — ABNORMAL HIGH (ref 0–200)
HDL: 52.4 mg/dL (ref >39.00)
LDL CALC: 182 mg/dL — AB (ref 0–99)
NonHDL: 196.51
Total CHOL/HDL Ratio: 5
Triglycerides: 72 mg/dL (ref 0.0–149.0)
VLDL: 14.4 mg/dL (ref 0.0–40.0)

## 2015-04-16 LAB — MAGNESIUM: MAGNESIUM: 2 mg/dL (ref 1.5–2.5)

## 2015-04-16 LAB — BASIC METABOLIC PANEL
BUN: 12 mg/dL (ref 6–23)
CHLORIDE: 103 meq/L (ref 96–112)
CO2: 32 mEq/L (ref 19–32)
CREATININE: 0.8 mg/dL (ref 0.40–1.20)
Calcium: 9.5 mg/dL (ref 8.4–10.5)
GFR: 96.22 mL/min (ref >60.00)
Glucose, Bld: 91 mg/dL (ref 70–99)
Potassium: 3.6 mEq/L (ref 3.5–5.1)
Sodium: 138 mEq/L (ref 135–145)

## 2015-04-16 LAB — TSH: TSH: 1.25 u[IU]/mL (ref 0.35–4.50)

## 2015-04-16 LAB — VITAMIN D 25 HYDROXY (VIT D DEFICIENCY, FRACTURES): VITD: 25.75 ng/mL — ABNORMAL LOW (ref 30.00–100.00)

## 2015-04-16 LAB — VITAMIN B12: Vitamin B-12: >1500 pg/mL — ABNORMAL HIGH (ref 211–911)

## 2015-04-16 NOTE — Patient Instructions (Signed)
Preventive Care for Adults A healthy lifestyle and preventive care can promote health and wellness. Preventive health guidelines for women include the following key practices.  A routine yearly physical is a good way to check with your health care provider about your health and preventive screening. It is a chance to share any concerns and updates on your health and to receive a thorough exam.  Visit your dentist for a routine exam and preventive care every 6 months. Brush your teeth twice a day and floss once a day. Good oral hygiene prevents tooth decay and gum disease.  The frequency of eye exams is based on your age, health, family medical history, use of contact lenses, and other factors. Follow your health care provider's recommendations for frequency of eye exams.  Eat a healthy diet. Foods like vegetables, fruits, whole grains, low-fat dairy products, and lean protein foods contain the nutrients you need without too many calories. Decrease your intake of foods high in solid fats, added sugars, and salt. Eat the right amount of calories for you.Get information about a proper diet from your health care provider, if necessary.  Regular physical exercise is one of the most important things you can do for your health. Most adults should get at least 150 minutes of moderate-intensity exercise (any activity that increases your heart rate and causes you to sweat) each week. In addition, most adults need muscle-strengthening exercises on 2 or more days a week.  Maintain a healthy weight. The body mass index (BMI) is a screening tool to identify possible weight problems. It provides an estimate of body fat based on height and weight. Your health care provider can find your BMI and can help you achieve or maintain a healthy weight.For adults 20 years and older:  A BMI below 18.5 is considered underweight.  A BMI of 18.5 to 24.9 is normal.  A BMI of 25 to 29.9 is considered overweight.  A BMI of  30 and above is considered obese.  Maintain normal blood lipids and cholesterol levels by exercising and minimizing your intake of saturated fat. Eat a balanced diet with plenty of fruit and vegetables. Blood tests for lipids and cholesterol should begin at age 76 and be repeated every 5 years. If your lipid or cholesterol levels are high, you are over 50, or you are at high risk for heart disease, you may need your cholesterol levels checked more frequently.Ongoing high lipid and cholesterol levels should be treated with medicines if diet and exercise are not working.  If you smoke, find out from your health care provider how to quit. If you do not use tobacco, do not start.  Lung cancer screening is recommended for adults aged 22-80 years who are at high risk for developing lung cancer because of a history of smoking. A yearly low-dose CT scan of the lungs is recommended for people who have at least a 30-pack-year history of smoking and are a current smoker or have quit within the past 15 years. A pack year of smoking is smoking an average of 1 pack of cigarettes a day for 1 year (for example: 1 pack a day for 30 years or 2 packs a day for 15 years). Yearly screening should continue until the smoker has stopped smoking for at least 15 years. Yearly screening should be stopped for people who develop a health problem that would prevent them from having lung cancer treatment.  If you are pregnant, do not drink alcohol. If you are breastfeeding,  be very cautious about drinking alcohol. If you are not pregnant and choose to drink alcohol, do not have more than 1 drink per day. One drink is considered to be 12 ounces (355 mL) of beer, 5 ounces (148 mL) of wine, or 1.5 ounces (44 mL) of liquor.  Avoid use of street drugs. Do not share needles with anyone. Ask for help if you need support or instructions about stopping the use of drugs.  High blood pressure causes heart disease and increases the risk of  stroke. Your blood pressure should be checked at least every 1 to 2 years. Ongoing high blood pressure should be treated with medicines if weight loss and exercise do not work.  If you are 75-52 years old, ask your health care provider if you should take aspirin to prevent strokes.  Diabetes screening involves taking a blood sample to check your fasting blood sugar level. This should be done once every 3 years, after age 15, if you are within normal weight and without risk factors for diabetes. Testing should be considered at a younger age or be carried out more frequently if you are overweight and have at least 1 risk factor for diabetes.  Breast cancer screening is essential preventive care for women. You should practice "breast self-awareness." This means understanding the normal appearance and feel of your breasts and may include breast self-examination. Any changes detected, no matter how small, should be reported to a health care provider. Women in their 58s and 30s should have a clinical breast exam (CBE) by a health care provider as part of a regular health exam every 1 to 3 years. After age 16, women should have a CBE every year. Starting at age 53, women should consider having a mammogram (breast X-ray test) every year. Women who have a family history of breast cancer should talk to their health care provider about genetic screening. Women at a high risk of breast cancer should talk to their health care providers about having an MRI and a mammogram every year.  Breast cancer gene (BRCA)-related cancer risk assessment is recommended for women who have family members with BRCA-related cancers. BRCA-related cancers include breast, ovarian, tubal, and peritoneal cancers. Having family members with these cancers may be associated with an increased risk for harmful changes (mutations) in the breast cancer genes BRCA1 and BRCA2. Results of the assessment will determine the need for genetic counseling and  BRCA1 and BRCA2 testing.  Routine pelvic exams to screen for cancer are no longer recommended for nonpregnant women who are considered low risk for cancer of the pelvic organs (ovaries, uterus, and vagina) and who do not have symptoms. Ask your health care provider if a screening pelvic exam is right for you.  If you have had past treatment for cervical cancer or a condition that could lead to cancer, you need Pap tests and screening for cancer for at least 20 years after your treatment. If Pap tests have been discontinued, your risk factors (such as having a new sexual partner) need to be reassessed to determine if screening should be resumed. Some women have medical problems that increase the chance of getting cervical cancer. In these cases, your health care provider may recommend more frequent screening and Pap tests.  The HPV test is an additional test that may be used for cervical cancer screening. The HPV test looks for the virus that can cause the cell changes on the cervix. The cells collected during the Pap test can be  tested for HPV. The HPV test could be used to screen women aged 30 years and older, and should be used in women of any age who have unclear Pap test results. After the age of 30, women should have HPV testing at the same frequency as a Pap test.  Colorectal cancer can be detected and often prevented. Most routine colorectal cancer screening begins at the age of 50 years and continues through age 75 years. However, your health care provider may recommend screening at an earlier age if you have risk factors for colon cancer. On a yearly basis, your health care provider may provide home test kits to check for hidden blood in the stool. Use of a small camera at the end of a tube, to directly examine the colon (sigmoidoscopy or colonoscopy), can detect the earliest forms of colorectal cancer. Talk to your health care provider about this at age 50, when routine screening begins. Direct  exam of the colon should be repeated every 5-10 years through age 75 years, unless early forms of pre-cancerous polyps or small growths are found.  People who are at an increased risk for hepatitis B should be screened for this virus. You are considered at high risk for hepatitis B if:  You were born in a country where hepatitis B occurs often. Talk with your health care provider about which countries are considered high risk.  Your parents were born in a high-risk country and you have not received a shot to protect against hepatitis B (hepatitis B vaccine).  You have HIV or AIDS.  You use needles to inject street drugs.  You live with, or have sex with, someone who has hepatitis B.  You get hemodialysis treatment.  You take certain medicines for conditions like cancer, organ transplantation, and autoimmune conditions.  Hepatitis C blood testing is recommended for all people born from 1945 through 1965 and any individual with known risks for hepatitis C.  Practice safe sex. Use condoms and avoid high-risk sexual practices to reduce the spread of sexually transmitted infections (STIs). STIs include gonorrhea, chlamydia, syphilis, trichomonas, herpes, HPV, and human immunodeficiency virus (HIV). Herpes, HIV, and HPV are viral illnesses that have no cure. They can result in disability, cancer, and death.  You should be screened for sexually transmitted illnesses (STIs) including gonorrhea and chlamydia if:  You are sexually active and are younger than 24 years.  You are older than 24 years and your health care provider tells you that you are at risk for this type of infection.  Your sexual activity has changed since you were last screened and you are at an increased risk for chlamydia or gonorrhea. Ask your health care provider if you are at risk.  If you are at risk of being infected with HIV, it is recommended that you take a prescription medicine daily to prevent HIV infection. This is  called preexposure prophylaxis (PrEP). You are considered at risk if:  You are a heterosexual woman, are sexually active, and are at increased risk for HIV infection.  You take drugs by injection.  You are sexually active with a partner who has HIV.  Talk with your health care provider about whether you are at high risk of being infected with HIV. If you choose to begin PrEP, you should first be tested for HIV. You should then be tested every 3 months for as long as you are taking PrEP.  Osteoporosis is a disease in which the bones lose minerals and strength   with aging. This can result in serious bone fractures or breaks. The risk of osteoporosis can be identified using a bone density scan. Women ages 65 years and over and women at risk for fractures or osteoporosis should discuss screening with their health care providers. Ask your health care provider whether you should take a calcium supplement or vitamin D to reduce the rate of osteoporosis.  Menopause can be associated with physical symptoms and risks. Hormone replacement therapy is available to decrease symptoms and risks. You should talk to your health care provider about whether hormone replacement therapy is right for you.  Use sunscreen. Apply sunscreen liberally and repeatedly throughout the day. You should seek shade when your shadow is shorter than you. Protect yourself by wearing long sleeves, pants, a wide-brimmed hat, and sunglasses year round, whenever you are outdoors.  Once a month, do a whole body skin exam, using a mirror to look at the skin on your back. Tell your health care provider of new moles, moles that have irregular borders, moles that are larger than a pencil eraser, or moles that have changed in shape or color.  Stay current with required vaccines (immunizations).  Influenza vaccine. All adults should be immunized every year.  Tetanus, diphtheria, and acellular pertussis (Td, Tdap) vaccine. Pregnant women should  receive 1 dose of Tdap vaccine during each pregnancy. The dose should be obtained regardless of the length of time since the last dose. Immunization is preferred during the 27th-36th week of gestation. An adult who has not previously received Tdap or who does not know her vaccine status should receive 1 dose of Tdap. This initial dose should be followed by tetanus and diphtheria toxoids (Td) booster doses every 10 years. Adults with an unknown or incomplete history of completing a 3-dose immunization series with Td-containing vaccines should begin or complete a primary immunization series including a Tdap dose. Adults should receive a Td booster every 10 years.  Varicella vaccine. An adult without evidence of immunity to varicella should receive 2 doses or a second dose if she has previously received 1 dose. Pregnant females who do not have evidence of immunity should receive the first dose after pregnancy. This first dose should be obtained before leaving the health care facility. The second dose should be obtained 4-8 weeks after the first dose.  Human papillomavirus (HPV) vaccine. Females aged 13-26 years who have not received the vaccine previously should obtain the 3-dose series. The vaccine is not recommended for use in pregnant females. However, pregnancy testing is not needed before receiving a dose. If a female is found to be pregnant after receiving a dose, no treatment is needed. In that case, the remaining doses should be delayed until after the pregnancy. Immunization is recommended for any person with an immunocompromised condition through the age of 26 years if she did not get any or all doses earlier. During the 3-dose series, the second dose should be obtained 4-8 weeks after the first dose. The third dose should be obtained 24 weeks after the first dose and 16 weeks after the second dose.  Zoster vaccine. One dose is recommended for adults aged 60 years or older unless certain conditions are  present.  Measles, mumps, and rubella (MMR) vaccine. Adults born before 1957 generally are considered immune to measles and mumps. Adults born in 1957 or later should have 1 or more doses of MMR vaccine unless there is a contraindication to the vaccine or there is laboratory evidence of immunity to   each of the three diseases. A routine second dose of MMR vaccine should be obtained at least 28 days after the first dose for students attending postsecondary schools, health care workers, or international travelers. People who received inactivated measles vaccine or an unknown type of measles vaccine during 1963-1967 should receive 2 doses of MMR vaccine. People who received inactivated mumps vaccine or an unknown type of mumps vaccine before 1979 and are at high risk for mumps infection should consider immunization with 2 doses of MMR vaccine. For females of childbearing age, rubella immunity should be determined. If there is no evidence of immunity, females who are not pregnant should be vaccinated. If there is no evidence of immunity, females who are pregnant should delay immunization until after pregnancy. Unvaccinated health care workers born before 1957 who lack laboratory evidence of measles, mumps, or rubella immunity or laboratory confirmation of disease should consider measles and mumps immunization with 2 doses of MMR vaccine or rubella immunization with 1 dose of MMR vaccine.  Pneumococcal 13-valent conjugate (PCV13) vaccine. When indicated, a person who is uncertain of her immunization history and has no record of immunization should receive the PCV13 vaccine. An adult aged 19 years or older who has certain medical conditions and has not been previously immunized should receive 1 dose of PCV13 vaccine. This PCV13 should be followed with a dose of pneumococcal polysaccharide (PPSV23) vaccine. The PPSV23 vaccine dose should be obtained at least 8 weeks after the dose of PCV13 vaccine. An adult aged 19  years or older who has certain medical conditions and previously received 1 or more doses of PPSV23 vaccine should receive 1 dose of PCV13. The PCV13 vaccine dose should be obtained 1 or more years after the last PPSV23 vaccine dose.  Pneumococcal polysaccharide (PPSV23) vaccine. When PCV13 is also indicated, PCV13 should be obtained first. All adults aged 65 years and older should be immunized. An adult younger than age 65 years who has certain medical conditions should be immunized. Any person who resides in a nursing home or long-term care facility should be immunized. An adult smoker should be immunized. People with an immunocompromised condition and certain other conditions should receive both PCV13 and PPSV23 vaccines. People with human immunodeficiency virus (HIV) infection should be immunized as soon as possible after diagnosis. Immunization during chemotherapy or radiation therapy should be avoided. Routine use of PPSV23 vaccine is not recommended for American Indians, [place] Natives, or people younger than 65 years unless there are medical conditions that require PPSV23 vaccine. When indicated, people who have unknown immunization and have no record of immunization should receive PPSV23 vaccine. One-time revaccination 5 years after the first dose of PPSV23 is recommended for people aged 19-64 years who have chronic kidney failure, nephrotic syndrome, asplenia, or immunocompromised conditions. People who received 1-2 doses of PPSV23 before age 65 years should receive another dose of PPSV23 vaccine at age 65 years or later if at least 5 years have passed since the previous dose. Doses of PPSV23 are not needed for people immunized with PPSV23 at or after age 65 years.  Meningococcal vaccine. Adults with asplenia or persistent complement component deficiencies should receive 2 doses of quadrivalent meningococcal conjugate (MenACWY-D) vaccine. The doses should be obtained at least 2 months apart.  Microbiologists working with certain meningococcal bacteria, military recruits, people at risk during an outbreak, and people who travel to or live in countries with a high rate of meningitis should be immunized. A first-year college student up through age   21 years who is living in a residence hall should receive a dose if she did not receive a dose on or after her 16th birthday. Adults who have certain high-risk conditions should receive one or more doses of vaccine.  Hepatitis A vaccine. Adults who wish to be protected from this disease, have certain high-risk conditions, work with hepatitis A-infected animals, work in hepatitis A research labs, or travel to or work in countries with a high rate of hepatitis A should be immunized. Adults who were previously unvaccinated and who anticipate close contact with an international adoptee during the first 60 days after arrival in the [place] from a country with a high rate of hepatitis A should be immunized.  Hepatitis B vaccine. Adults who wish to be protected from this disease, have certain high-risk conditions, may be exposed to blood or other infectious body fluids, are household contacts or sex partners of hepatitis B positive people, are clients or workers in certain care facilities, or travel to or work in countries with a high rate of hepatitis B should be immunized.  Haemophilus influenzae type b (Hib) vaccine. A previously unvaccinated person with asplenia or sickle cell disease or having a scheduled splenectomy should receive 1 dose of Hib vaccine. Regardless of previous immunization, a recipient of a hematopoietic stem cell transplant should receive a 3-dose series 6-12 months after her successful transplant. Hib vaccine is not recommended for adults with HIV infection. Preventive Services / Frequency Ages 64 to 68 years  Blood pressure check.** / Every 1 to 2 years.  Lipid and cholesterol check.** / Every 5 years beginning at age  22.  Clinical breast exam.** / Every 3 years for women in their 88s and 53s.  BRCA-related cancer risk assessment.** / For women who have family members with a BRCA-related cancer (breast, ovarian, tubal, or peritoneal cancers).  Pap test.** / Every 2 years from ages 90 through 51. Every 3 years starting at age 21 through age 56 or 3 with a history of 3 consecutive normal Pap tests.  HPV screening.** / Every 3 years from ages 24 through ages 1 to 46 with a history of 3 consecutive normal Pap tests.  Hepatitis C blood test.** / For any individual with known risks for hepatitis C.  Skin self-exam. / Monthly.  Influenza vaccine. / Every year.  Tetanus, diphtheria, and acellular pertussis (Tdap, Td) vaccine.** / Consult your health care provider. Pregnant women should receive 1 dose of Tdap vaccine during each pregnancy. 1 dose of Td every 10 years.  Varicella vaccine.** / Consult your health care provider. Pregnant females who do not have evidence of immunity should receive the first dose after pregnancy.  HPV vaccine. / 3 doses over 6 months, if 72 and younger. The vaccine is not recommended for use in pregnant females. However, pregnancy testing is not needed before receiving a dose.  Measles, mumps, rubella (MMR) vaccine.** / You need at least 1 dose of MMR if you were born in 1957 or later. You may also need a 2nd dose. For females of childbearing age, rubella immunity should be determined. If there is no evidence of immunity, females who are not pregnant should be vaccinated. If there is no evidence of immunity, females who are pregnant should delay immunization until after pregnancy.  Pneumococcal 13-valent conjugate (PCV13) vaccine.** / Consult your health care provider.  Pneumococcal polysaccharide (PPSV23) vaccine.** / 1 to 2 doses if you smoke cigarettes or if you have certain conditions.  Meningococcal vaccine.** /  1 dose if you are age 19 to 21 years and a first-year college  student living in a residence hall, or have one of several medical conditions, you need to get vaccinated against meningococcal disease. You may also need additional booster doses.  Hepatitis A vaccine.** / Consult your health care provider.  Hepatitis B vaccine.** / Consult your health care provider.  Haemophilus influenzae type b (Hib) vaccine.** / Consult your health care provider. Ages 40 to 64 years  Blood pressure check.** / Every 1 to 2 years.  Lipid and cholesterol check.** / Every 5 years beginning at age 20 years.  Lung cancer screening. / Every year if you are aged 55-80 years and have a 30-pack-year history of smoking and currently smoke or have quit within the past 15 years. Yearly screening is stopped once you have quit smoking for at least 15 years or develop a health problem that would prevent you from having lung cancer treatment.  Clinical breast exam.** / Every year after age 40 years.  BRCA-related cancer risk assessment.** / For women who have family members with a BRCA-related cancer (breast, ovarian, tubal, or peritoneal cancers).  Mammogram.** / Every year beginning at age 40 years and continuing for as long as you are in good health. Consult with your health care provider.  Pap test.** / Every 3 years starting at age 30 years through age 65 or 70 years with a history of 3 consecutive normal Pap tests.  HPV screening.** / Every 3 years from ages 30 years through ages 65 to 70 years with a history of 3 consecutive normal Pap tests.  Fecal occult blood test (FOBT) of stool. / Every year beginning at age 50 years and continuing until age 75 years. You may not need to do this test if you get a colonoscopy every 10 years.  Flexible sigmoidoscopy or colonoscopy.** / Every 5 years for a flexible sigmoidoscopy or every 10 years for a colonoscopy beginning at age 50 years and continuing until age 75 years.  Hepatitis C blood test.** / For all people born from 1945 through  1965 and any individual with known risks for hepatitis C.  Skin self-exam. / Monthly.  Influenza vaccine. / Every year.  Tetanus, diphtheria, and acellular pertussis (Tdap/Td) vaccine.** / Consult your health care provider. Pregnant women should receive 1 dose of Tdap vaccine during each pregnancy. 1 dose of Td every 10 years.  Varicella vaccine.** / Consult your health care provider. Pregnant females who do not have evidence of immunity should receive the first dose after pregnancy.  Zoster vaccine.** / 1 dose for adults aged 60 years or older.  Measles, mumps, rubella (MMR) vaccine.** / You need at least 1 dose of MMR if you were born in 1957 or later. You may also need a 2nd dose. For females of childbearing age, rubella immunity should be determined. If there is no evidence of immunity, females who are not pregnant should be vaccinated. If there is no evidence of immunity, females who are pregnant should delay immunization until after pregnancy.  Pneumococcal 13-valent conjugate (PCV13) vaccine.** / Consult your health care provider.  Pneumococcal polysaccharide (PPSV23) vaccine.** / 1 to 2 doses if you smoke cigarettes or if you have certain conditions.  Meningococcal vaccine.** / Consult your health care provider.  Hepatitis A vaccine.** / Consult your health care provider.  Hepatitis B vaccine.** / Consult your health care provider.  Haemophilus influenzae type b (Hib) vaccine.** / Consult your health care provider. Ages 65   years and over  Blood pressure check.** / Every 1 to 2 years.  Lipid and cholesterol check.** / Every 5 years beginning at age 22 years.  Lung cancer screening. / Every year if you are aged 73-80 years and have a 30-pack-year history of smoking and currently smoke or have quit within the past 15 years. Yearly screening is stopped once you have quit smoking for at least 15 years or develop a health problem that would prevent you from having lung cancer  treatment.  Clinical breast exam.** / Every year after age 4 years.  BRCA-related cancer risk assessment.** / For women who have family members with a BRCA-related cancer (breast, ovarian, tubal, or peritoneal cancers).  Mammogram.** / Every year beginning at age 40 years and continuing for as long as you are in good health. Consult with your health care provider.  Pap test.** / Every 3 years starting at age 9 years through age 34 or 91 years with 3 consecutive normal Pap tests. Testing can be stopped between 65 and 70 years with 3 consecutive normal Pap tests and no abnormal Pap or HPV tests in the past 10 years.  HPV screening.** / Every 3 years from ages 57 years through ages 64 or 45 years with a history of 3 consecutive normal Pap tests. Testing can be stopped between 65 and 70 years with 3 consecutive normal Pap tests and no abnormal Pap or HPV tests in the past 10 years.  Fecal occult blood test (FOBT) of stool. / Every year beginning at age 15 years and continuing until age 17 years. You may not need to do this test if you get a colonoscopy every 10 years.  Flexible sigmoidoscopy or colonoscopy.** / Every 5 years for a flexible sigmoidoscopy or every 10 years for a colonoscopy beginning at age 86 years and continuing until age 71 years.  Hepatitis C blood test.** / For all people born from [birth year] through 1965 and any individual with known risks for hepatitis C.  Osteoporosis screening.** / A one-time screening for women ages 83 years and over and women at risk for fractures or osteoporosis.  Skin self-exam. / Monthly.  Influenza vaccine. / Every year.  Tetanus, diphtheria, and acellular pertussis (Tdap/Td) vaccine.** / 1 dose of Td every 10 years.  Varicella vaccine.** / Consult your health care provider.  Zoster vaccine.** / 1 dose for adults aged 61 years or older.  Pneumococcal 13-valent conjugate (PCV13) vaccine.** / Consult your health care provider.  Pneumococcal  polysaccharide (PPSV23) vaccine.** / 1 dose for all adults aged 28 years and older.  Meningococcal vaccine.** / Consult your health care provider.  Hepatitis A vaccine.** / Consult your health care provider.  Hepatitis B vaccine.** / Consult your health care provider.  Haemophilus influenzae type b (Hib) vaccine.** / Consult your health care provider. ** Family history and personal history of risk and conditions may change your health care provider's recommendations. Document Released: 10/13/2001 Document Revised: 01/01/2014 Document Reviewed: 01/12/2011 [facility] Patient Information 2015 [place], [place]. This information is not intended to replace advice given to you by your health care provider. Make sure you discuss any questions you have with your health care provider.

## 2015-04-16 NOTE — Progress Notes (Signed)
Pre visit review using our clinic review tool, if applicable. No additional management support is needed unless otherwise documented below in the visit note.  Subjective:     Lisa Lawson is a 53 y.o. female and is here for a comprehensive physical exam. The patient reports no problems.  Social History   Social History  . Marital Status: Single    Spouse Name: N/A  . Number of Children: N/A  . Years of Education: N/A   Occupational History  . Not on file.   Social History Main Topics  . Smoking status: Current Every Day Smoker -- 1.00 packs/day for 30 years    Types: Cigarettes  . Smokeless tobacco: Never Used  . Alcohol Use: Yes     Comment: rare  . Drug Use: No  . Sexual Activity:    Partners: Male   Other Topics Concern  . Not on file   Social History Narrative   Health Maintenance  Topic Date Due  . Hepatitis C Screening  [birth year]  . HIV Screening  07/20/1976  . COLONOSCOPY  07/21/2011  . PAP SMEAR  03/22/2014  . INFLUENZA VACCINE  04/01/2015  . MAMMOGRAM  06/21/2015  . TETANUS/TDAP  02/11/2016    The following portions of the patient's history were reviewed and updated as appropriate:  She  has a past medical history of Hyperlipidemia. She  does not have any pertinent problems on file. She  has past surgical history that includes Cesarean section and Breast biopsy. Her family history includes Cancer in her father; Diabetes in her mother; Hypertension in her mother; Lung cancer in her father. She  reports that she has been smoking Cigarettes.  She has a 30 pack-year smoking history. She has never used smokeless tobacco. She reports that she drinks alcohol. She reports that she does not use illicit drugs. She has a current medication list which includes the following prescription(s): vitamin c, vitamin b-12, aspirin ec, beclomethasone dipropionate, guaifenesin-codeine, hydrochlorothiazide, nicotine, oseltamivir, potassium chloride sa, and red yeast  rice. Current Outpatient Prescriptions on File Prior to Visit  Medication Sig Dispense Refill  . Ascorbic Acid (VITAMIN C) 100 MG tablet Take 100 mg by mouth daily.      . vitamin B-12 (CYANOCOBALAMIN) 1000 MCG tablet Take 1,000 mcg by mouth daily.      Marland Kitchen aspirin EC 81 MG tablet Take 1 tablet (81 mg total) by mouth daily. (Patient not taking: Reported on 04/16/2015)    . Beclomethasone Dipropionate (QNASL) 80 MCG/ACT AERS Place 2 sprays into the nose daily. (Patient not taking: Reported on 04/16/2015)    . guaiFENesin-codeine (ROBITUSSIN AC) 100-10 MG/5ML syrup 1-2 tsp po qhs prn (Patient not taking: Reported on 04/16/2015) 120 mL 0  . hydrochlorothiazide (HYDRODIURIL) 25 MG tablet 1 tab by mouth--Labs are due now (Patient not taking: Reported on 04/16/2015) 100 tablet 0  . nicotine (NICOTROL) 10 MG inhaler Inhale 1 puff into the lungs as needed for smoking cessation. (Patient not taking: Reported on 04/16/2015) 42 each 0  . oseltamivir (TAMIFLU) 75 MG capsule Take 1 capsule (75 mg total) by mouth 2 (two) times daily. 10 capsule 0  . potassium chloride SA (K-DUR,KLOR-CON) 20 MEQ tablet 1 tab by mouth daily (Patient not taking: Reported on 04/16/2015) 90 tablet 1  . Red Yeast Rice 600 MG TABS Take 1 tablet by mouth daily.       No current facility-administered medications on file prior to visit.   She has No Known Allergies..  Review  of Systems Review of Systems  Constitutional: Negative for activity change, appetite change and fatigue.  HENT: Negative for hearing loss, congestion, tinnitus and ear discharge.  dentist q96m Eyes: Negative for visual disturbance (see optho q1y -- vision corrected to 20/20 with glasses).  Respiratory: Negative for cough, chest tightness and shortness of breath.   Cardiovascular: Negative for chest pain, palpitations and leg swelling.  Gastrointestinal: Negative for abdominal pain, diarrhea, constipation and abdominal distention.  Genitourinary: Negative for urgency,  frequency, decreased urine volume and difficulty urinating.  Musculoskeletal: Negative for back pain, arthralgias and gait problem.  Skin: Negative for color change, pallor and rash.  Neurological: Negative for dizziness, light-headedness, numbness and headaches.  Hematological: Negative for adenopathy. Does not bruise/bleed easily.  Psychiatric/Behavioral: Negative for suicidal ideas, confusion, sleep disturbance, self-injury, dysphoric mood, decreased concentration and agitation.        Objective:    BP 114/74 mmHg  Pulse 96  Temp(Src) 98.1 F (36.7 C) (Oral)  Resp 18  Ht  (1.575 m)  Wt 167 lb 9.6 oz (76.023 kg)  BMI 30.65 kg/m2  SpO2 100% General appearance: alert, cooperative, appears stated age and no distress Head: Normocephalic, without obvious abnormality, atraumatic Eyes: conjunctivae/corneas clear. PERRL, EOM's intact. Fundi benign. Ears: normal TM's and external ear canals both ears Nose: Nares normal. Septum midline. Mucosa normal. No drainage or sinus tenderness. Throat: lips, mucosa, and tongue normal; teeth and gums normal Neck: no adenopathy, no carotid bruit, no JVD, supple, symmetrical, trachea midline and thyroid not enlarged, symmetric, no tenderness/mass/nodules Back: symmetric, no curvature. ROM normal. No CVA tenderness. Lungs: clear to auscultation bilaterally Breasts: normal appearance, no masses or tenderness Heart: regular rate and rhythm, S1, S2 normal, no murmur, click, rub or gallop Abdomen: soft, non-tender; bowel sounds normal; no masses,  no organomegaly Pelvic: cervix normal in appearance, external genitalia normal, no adnexal masses or tenderness, no cervical motion tenderness, rectovaginal septum normal, uterus normal size, shape, and consistency, vagina normal without discharge and pap done, rectal heme neg brown stool Extremities: extremities normal, atraumatic, no cyanosis or edema Pulses: 2+ and symmetric Skin: Skin color, texture,  turgor normal. No rashes or lesions Lymph nodes: Cervical, supraclavicular, and axillary nodes normal. Neurologic: Alert and oriented X 3, normal strength and tone. Normal symmetric reflexes. Normal coordination and gait Psych-- no anxiety, no depression      Assessment:    Healthy female exam.       Plan:     ghm utd Check labs\ See After Visit Summary for Counseling Recommendations    1. Osteopenia   - Vit D  25 hydroxy (rtn osteoporosis monitoring)  2. Hyperlipidemia    3. Other vitamin B12 deficiency anemia    4. Preventative health care   - CBC with Differential/Platelet - Basic metabolic panel - Lipid panel - TSH - POCT urinalysis dipstick - HIV antibody (with reflex) - Hepatitis C Antibody - MM Digital Screening; Future - Vitamin B12 - Vit D  25 hydroxy (rtn osteoporosis monitoring)  5. Cervical cancer screening   - Cytology - PAP  6. Cramps, extremity   - Magnesium  7. Postmenopausal vaginal bleeding   - Ambulatory referral to Gynecology

## 2015-04-17 LAB — HEPATITIS C ANTIBODY: HCV AB: NEGATIVE

## 2015-04-17 LAB — HIV ANTIBODY (ROUTINE TESTING W REFLEX): HIV: NONREACTIVE

## 2015-04-18 LAB — CYTOLOGY - PAP

## 2015-04-19 LAB — POCT URINALYSIS DIPSTICK
Bilirubin, UA: NEGATIVE
Blood, UA: NEGATIVE
Glucose, UA: NEGATIVE
KETONES UA: NEGATIVE
Leukocytes, UA: NEGATIVE
Nitrite, UA: NEGATIVE
PROTEIN UA: NEGATIVE
Spec Grav, UA: 1.02
UROBILINOGEN UA: NEGATIVE
pH, UA: 8

## 2015-04-25 MED ORDER — HYDROMORPHONE HCL 1 MG/ML IJ SOLN
INTRAMUSCULAR | Status: AC
  Filled 2015-04-25: qty 1

## 2015-05-03 ENCOUNTER — Telehealth: Payer: Self-pay | Admitting: Family Medicine

## 2015-05-03 ENCOUNTER — Other Ambulatory Visit: Payer: Self-pay

## 2015-05-03 MED ORDER — VITAMIN D (ERGOCALCIFEROL) 1.25 MG (50000 UNIT) PO CAPS: 50000.0000 [IU] | ORAL_CAPSULE | ORAL | Status: DC

## 2015-05-03 NOTE — Telephone Encounter (Signed)
See results.     KP 

## 2015-05-03 NOTE — Telephone Encounter (Signed)
Patient returning your call regarding lab results. Best [phone]

## 2015-05-07 ENCOUNTER — Telehealth: Payer: Self-pay | Admitting: Obstetrics & Gynecology

## 2015-05-07 ENCOUNTER — Other Ambulatory Visit: Payer: Self-pay | Admitting: Family Medicine

## 2015-05-07 DIAGNOSIS — Z1231 Encounter for screening mammogram for malignant neoplasm of breast: Secondary | ICD-10-CM

## 2015-05-21 ENCOUNTER — Ambulatory Visit (HOSPITAL_BASED_OUTPATIENT_CLINIC_OR_DEPARTMENT_OTHER)
Admission: RE | Admit: 2015-05-21 | Discharge: 2015-05-21 | Disposition: A | Discharge status: Home / Self Care | Payer: BC Managed Care – PPO | Source: Ambulatory Visit | Attending: Family Medicine | Admitting: Family Medicine

## 2015-05-21 DIAGNOSIS — Z1231 Encounter for screening mammogram for malignant neoplasm of breast: Secondary | ICD-10-CM | POA: Diagnosis present

## 2015-05-22 ENCOUNTER — Encounter: Payer: Self-pay | Admitting: Family Medicine

## 2015-05-22 ENCOUNTER — Ambulatory Visit (INDEPENDENT_AMBULATORY_CARE_PROVIDER_SITE_OTHER): Payer: BC Managed Care – PPO | Admitting: Family Medicine

## 2015-05-22 VITALS — BP 121/81 | HR 92 | Ht 65.0 in | Wt 170.0 lb

## 2015-05-22 DIAGNOSIS — N950 Postmenopausal bleeding: Secondary | ICD-10-CM | POA: Diagnosis not present

## 2015-05-22 NOTE — Progress Notes (Signed)
   Subjective:    Patient ID: Lisa [place], female    DOB: 24-Sep-1960, 54 y.o.   MRN: [id number]  HPI [name] is a 54 year old woman who was referred to our office due to postmenopausal bleeding that occurred in the beginning of August. Exact date unknown. The patient woke up and had a small amount of blood on her underwear. She had no further episodes that day, nor has she had any vaginal bleeding since that episode. She reports no sexual activity prior to the episode of bleeding. She went through menopause at age 54. She denies cramping, vaginal irritation, vaginal discharge, abdominal pain. She reports that there is no family history of uterine, ovarian, breast cancers.   Review of Systems  Constitutional: Negative for fever, chills, fatigue and unexpected weight change.  Respiratory: Negative for shortness of breath and wheezing.   Cardiovascular: Negative for chest pain.  Gastrointestinal: Negative for nausea, vomiting, abdominal pain, diarrhea and constipation.  Genitourinary: Negative for urgency, decreased urine volume, vaginal discharge, difficulty urinating, vaginal pain and pelvic pain.  All other systems reviewed and are negative.   I have reviewed the patients past medical, family, and social history.  I have reviewed the patient's medication list and allergies.     BP 121/81 mmHg  Pulse 92  Ht  (1.651 m)  Wt 170 lb (77.111 kg)  BMI 28.29 kg/m2  Objective:   Physical Exam  Constitutional: She is oriented to person, place, and time. She appears well-developed and well-nourished.  HENT:  Head: Normocephalic and atraumatic.  Right Ear: External ear normal.  Left Ear: External ear normal.  Eyes: Pupils are equal, round, and reactive to light.  Neck: Normal range of motion. Neck supple. No tracheal deviation present. No thyromegaly present.  Cardiovascular: Normal rate and regular rhythm.  Exam reveals no gallop and no friction rub.   No murmur  heard. Pulmonary/Chest: Effort normal and breath sounds normal. No respiratory distress. She has no wheezes. She has no rales. She exhibits no tenderness.  Abdominal: Soft. She exhibits no distension and no mass. There is no tenderness. There is no rebound and no guarding.  Neurological: She is alert and oriented to person, place, and time. No cranial nerve deficit.  Skin: Skin is warm and dry.  Psychiatric: She has a normal mood and affect. Her behavior is normal. Judgment and thought content normal.      Assessment & Plan:  1. Postmenopausal bleeding Patient at low risk for endometrial cancer due to single episode of bleeding, age, lack of diabetes, BMI of 28. Will obtain an ultrasound - if endometrial thickness is greater than 4 cm, will need endometrial biopsy.  - US Transvaginal Non-OB; Future - US Pelvis Complete; Future

## 2015-05-23 ENCOUNTER — Ambulatory Visit (HOSPITAL_BASED_OUTPATIENT_CLINIC_OR_DEPARTMENT_OTHER)
Admission: RE | Admit: 2015-05-23 | Discharge: 2015-05-23 | Disposition: A | Discharge status: Home / Self Care | Payer: BC Managed Care – PPO | Source: Ambulatory Visit | Attending: Family Medicine | Admitting: Family Medicine

## 2015-05-23 DIAGNOSIS — N950 Postmenopausal bleeding: Secondary | ICD-10-CM | POA: Diagnosis present

## 2015-05-23 DIAGNOSIS — N858 Other specified noninflammatory disorders of uterus: Secondary | ICD-10-CM | POA: Insufficient documentation

## 2015-05-27 ENCOUNTER — Telehealth: Payer: Self-pay

## 2015-05-27 NOTE — Telephone Encounter (Signed)
-----   Message from Levie Heritage, DO sent at 05/27/2015  3:04 PM EDT ----- Please let patient know that her endometrial thickness was normal.  There were a several small fibroids, which is inconsequential.  No further workup needed.

## 2015-05-27 NOTE — Telephone Encounter (Signed)
Patient called and made aware that ultrasound was normal and that Dr. Adrian Blackwater did not recommend any further follow up. Patient states understanding. Armandina Stammer RN BSN

## 2015-07-08 ENCOUNTER — Telehealth: Payer: Self-pay

## 2015-07-08 MED ORDER — VITAMIN D (ERGOCALCIFEROL) 1.25 MG (50000 UNIT) PO CAPS: 50000.0000 [IU] | ORAL_CAPSULE | ORAL | Status: DC

## 2015-07-08 NOTE — Telephone Encounter (Signed)
Patient called wanting her Vitamin D sent to [facility] in [place] she did not get her last rx

## 2015-07-08 NOTE — Telephone Encounter (Signed)
Rx faxed.    KP 

## 2015-07-09 ENCOUNTER — Encounter: Payer: Self-pay | Admitting: Family Medicine

## 2015-07-09 ENCOUNTER — Ambulatory Visit (INDEPENDENT_AMBULATORY_CARE_PROVIDER_SITE_OTHER): Payer: BC Managed Care – PPO | Admitting: Family Medicine

## 2015-07-09 ENCOUNTER — Ambulatory Visit (HOSPITAL_BASED_OUTPATIENT_CLINIC_OR_DEPARTMENT_OTHER)
Admission: RE | Admit: 2015-07-09 | Discharge: 2015-07-09 | Disposition: A | Discharge status: Home / Self Care | Payer: BC Managed Care – PPO | Source: Ambulatory Visit | Attending: Family Medicine | Admitting: Family Medicine

## 2015-07-09 VITALS — BP 122/90 | HR 107 | Temp 98.2°F | Wt 178.0 lb

## 2015-07-09 DIAGNOSIS — M542 Cervicalgia: Secondary | ICD-10-CM | POA: Insufficient documentation

## 2015-07-09 DIAGNOSIS — S161XXA Strain of muscle, fascia and tendon at neck level, initial encounter: Secondary | ICD-10-CM

## 2015-07-09 MED ORDER — CYCLOBENZAPRINE HCL 10 MG PO TABS
10.0000 mg | ORAL_TABLET | Freq: Three times a day (TID) | ORAL | Status: AC | PRN
Start: 2015-07-09 — End: ?

## 2015-07-09 NOTE — Patient Instructions (Signed)
0  Cervical Sprain A cervical sprain is an injury in the neck in which the strong, fibrous tissues (ligaments) that connect your neck bones stretch or tear. Cervical sprains can range from mild to severe. Severe cervical sprains can cause the neck vertebrae to be unstable. This can lead to damage of the spinal cord and can result in serious nervous system problems. The amount of time it takes for a cervical sprain to get better depends on the cause and extent of the injury. Most cervical sprains heal in 1 to 3 weeks. CAUSES  Severe cervical sprains may be caused by:   Contact sport injuries (such as from football, rugby, wrestling, hockey, auto racing, gymnastics, diving, martial arts, or boxing).   Motor vehicle collisions.   Whiplash injuries. This is an injury from a sudden forward and backward whipping movement of the head and neck.  Falls.  Mild cervical sprains may be caused by:   Being in an awkward position, such as while cradling a telephone between your ear and shoulder.   Sitting in a chair that does not offer proper support.   Working at a poorly Marketing executive station.   Looking up or down for long periods of time.  SYMPTOMS   Pain, soreness, stiffness, or a burning sensation in the front, back, or sides of the neck. This discomfort may develop immediately after the injury or slowly, 24 hours or more after the injury.   Pain or tenderness directly in the middle of the back of the neck.   Shoulder or upper back pain.   Limited ability to move the neck.   Headache.   Dizziness.   Weakness, numbness, or tingling in the hands or arms.   Muscle spasms.   Difficulty swallowing or chewing.   Tenderness and swelling of the neck.  DIAGNOSIS  Most of the time your health care provider can diagnose a cervical sprain by taking your history and doing a physical exam. Your health care provider will ask about previous neck injuries and any known neck  problems, such as arthritis in the neck. X-rays may be taken to find out if there are any other problems, such as with the bones of the neck. Other tests, such as a CT scan or MRI, may also be needed.  TREATMENT  Treatment depends on the severity of the cervical sprain. Mild sprains can be treated with rest, keeping the neck in place (immobilization), and pain medicines. Severe cervical sprains are immediately immobilized. Further treatment is done to help with pain, muscle spasms, and other symptoms and may include:  Medicines, such as pain relievers, numbing medicines, or muscle relaxants.   Physical therapy. This may involve stretching exercises, strengthening exercises, and posture training. Exercises and improved posture can help stabilize the neck, strengthen muscles, and help stop symptoms from returning.  HOME CARE INSTRUCTIONS   Put ice on the injured area.   Put ice in a plastic bag.   Place a towel between your skin and the bag.   Leave the ice on for 15-20 minutes, 3-4 times a day.   If your injury was severe, you may have been given a cervical collar to wear. A cervical collar is a two-piece collar designed to keep your neck from moving while it heals.  Do not remove the collar unless instructed by your health care provider.  If you have long hair, keep it outside of the collar.  Ask your health care provider before making any adjustments to your  collar. Minor adjustments may be required over time to improve comfort and reduce pressure on your chin or on the back of your head.  Ifyou are allowed to remove the collar for cleaning or bathing, follow your health care provider's instructions on how to do so safely.  Keep your collar clean by wiping it with mild soap and water and drying it completely. If the collar you have been given includes removable pads, remove them every 1-2 days and hand wash them with soap and water. Allow them to air dry. They should be completely  dry before you wear them in the collar.  If you are allowed to remove the collar for cleaning and bathing, wash and dry the skin of your neck. Check your skin for irritation or sores. If you see any, tell your health care provider.  Do not drive while wearing the collar.   Only take over-the-counter or prescription medicines for pain, discomfort, or fever as directed by your health care provider.   Keep all follow-up appointments as directed by your health care provider.   Keep all physical therapy appointments as directed by your health care provider.   Make any needed adjustments to your workstation to promote good posture.   Avoid positions and activities that make your symptoms worse.   Warm up and stretch before being active to help prevent problems.  SEEK MEDICAL CARE IF:   Your pain is not controlled with medicine.   You are unable to decrease your pain medicine over time as planned.   Your activity level is not improving as expected.  SEEK IMMEDIATE MEDICAL CARE IF:   You develop any bleeding.  You develop stomach upset.  You have signs of an allergic reaction to your medicine.   Your symptoms get worse.   You develop new, unexplained symptoms.   You have numbness, tingling, weakness, or paralysis in any part of your body.  MAKE SURE YOU:   Understand these instructions.  Will watch your condition.  Will get help right away if you are not doing well or get worse.   This information is not intended to replace advice given to you by your health care provider. Make sure you discuss any questions you have with your health care provider.   Document Released: 06/14/2007 Document Revised: 08/22/2013 Document Reviewed: 02/22/2013 [employer] Interactive Patient Education [employer]

## 2015-07-09 NOTE — Progress Notes (Signed)
Patient ID: Lisa ShownLaura A Buckholtz, female    DOB: 05/23/1961  Age: 54 y.o. MRN: [id number]    Subjective:  Subjective HPI Lisa Lawson presents for c/o r side neck pain and shoulder with numbness down r arm x 2 weeks.  Last sat pt went to UC and was given muscle relaxer and hydrocodone.   She still has pain and is able now to turn her head slightly but it is still causing pain.  No known injury.     Review of Systems  Constitutional: Negative for diaphoresis, appetite change, fatigue and unexpected weight change.  Eyes: Negative for pain, redness and visual disturbance.  Respiratory: Negative for cough, chest tightness, shortness of breath and wheezing.   Cardiovascular: Negative for chest pain, palpitations and leg swelling.  Endocrine: Negative for cold intolerance, heat intolerance, polydipsia, polyphagia and polyuria.  Genitourinary: Negative for dysuria, frequency and difficulty urinating.  Musculoskeletal: Positive for myalgias, neck pain and neck stiffness.  Neurological: Positive for numbness. Negative for dizziness, weakness, light-headedness and headaches.    History Past Medical History  Diagnosis Date  . Hyperlipidemia     She has past surgical history that includes Cesarean section and Breast biopsy.   Her family history includes Cancer in her father; Diabetes in her mother; Hypertension in her mother; Lung cancer in her father.She reports that she has been smoking Cigarettes.  She has a 30 pack-year smoking history. She has never used smokeless tobacco. She reports that she drinks alcohol. She reports that she does not use illicit drugs.  Current Outpatient Prescriptions on File Prior to Visit  Medication Sig Dispense Refill  . Ascorbic Acid (VITAMIN C) 100 MG tablet Take 100 mg by mouth daily.      . vitamin B-12 (CYANOCOBALAMIN) 1000 MCG tablet Take 1,000 mcg by mouth daily.      . Vitamin D, Ergocalciferol, (DRISDOL) 50000 UNITS CAPS capsule Take 1 capsule (50,000 Units  total) by mouth every 7 (seven) days. 4 capsule 2   No current facility-administered medications on file prior to visit.     Objective:  Objective Physical Exam  Constitutional: She is oriented to person, place, and time. She appears well-developed and well-nourished.  HENT:  Head: Normocephalic and atraumatic.  Eyes: Conjunctivae and EOM are normal.  Neck: Neck supple. Muscular tenderness present. No spinous process tenderness present. Normal range of motion present. No Brudzinski's sign and no Kernig's sign noted.    Cardiovascular: Normal rate, regular rhythm and normal heart sounds.   No murmur heard. Pulmonary/Chest: Effort normal and breath sounds normal. No respiratory distress. She has no wheezes. She has no rales. She exhibits no tenderness.  Musculoskeletal: She exhibits tenderness. She exhibits no edema.  Neurological: She is alert and oriented to person, place, and time.  Psychiatric: She has a normal mood and affect. Her behavior is normal.   BP 122/90 mmHg  Pulse 107  Temp(Src) 98.2 F (36.8 C) (Oral)  Wt 178 lb (80.74 kg)  SpO2 98% Wt Readings from Last 3 Encounters:  07/09/15 178 lb (80.74 kg)  05/22/15 170 lb (77.111 kg)  04/16/15 167 lb 9.6 oz (76.023 kg)     Lab Results  Component Value Date   WBC 8.7 04/16/2015   HGB 13.4 04/16/2015   HCT 40.7 04/16/2015   PLT 289.0 04/16/2015   GLUCOSE 91 04/16/2015   CHOL 249* 04/16/2015   TRIG 72.0 04/16/2015   HDL 52.40 04/16/2015   LDLDIRECT 172.1 07/04/2013   LDLCALC 182* 04/16/2015  ALT 16 07/04/2013   AST 19 07/04/2013   NA 138 04/16/2015   K 3.6 04/16/2015   CL 103 04/16/2015   CREATININE 0.80 04/16/2015   BUN 12 04/16/2015   CO2 32 04/16/2015   TSH 1.25 04/16/2015   HGBA1C 6.2 03/23/2011    US Transvaginal Non-ob  05/23/2015  CLINICAL DATA:  Postmenopausal bleeding. EXAM: TRANSABDOMINAL AND TRANSVAGINAL ULTRASOUND OF PELVIS TECHNIQUE: Both transabdominal and transvaginal ultrasound  examinations of the pelvis were performed. Transabdominal technique was performed for global imaging of the pelvis including uterus, ovaries, adnexal regions, and pelvic cul-de-sac. It was necessary to proceed with endovaginal exam following the transabdominal exam to visualize the endometrial stripe and ovaries. COMPARISON:  None FINDINGS: Uterus Measurements: 7.2 x 2.6 x 3.9 cm. Heterogeneous echotexture of uterine myometrium noted. Several small 1-2 cm intramural fibroids are suspected. Endometrium Thickness: 3 mm.  No focal abnormality visualized. Right ovary Measurements: 2.8 x 1.2 x 1.9 cm. Normal appearance/no adnexal mass. Left ovary Measurements: 1.7 x 1.1 cm. Normal appearance/no adnexal mass. Other findings No free fluid. IMPRESSION: Diffusely heterogeneous myometrial echotexture. Several small uterine fibroids suspected measuring up to 2 cm. Endometrial thickness measures 3 mm. In the setting of post-menopausal bleeding, this is consistent with a benign etiology such as endometrial atrophy. If bleeding remains unresponsive to hormonal or medical therapy, sonohysterogram should be considered for focal lesion work-up. (Ref: Radiological Reasoning: Algorithmic Workup of Abnormal Vaginal Bleeding with Endovaginal Sonography and Sonohysterography. AJR 2008; 536:U44-03) Normal appearance of both ovaries.  No adnexal mass identified. Electronically Signed   By: Myles Rosenthal M.D.   On: 05/23/2015 17:42   US Pelvis Complete  05/23/2015  CLINICAL DATA:  Postmenopausal bleeding. EXAM: TRANSABDOMINAL AND TRANSVAGINAL ULTRASOUND OF PELVIS TECHNIQUE: Both transabdominal and transvaginal ultrasound examinations of the pelvis were performed. Transabdominal technique was performed for global imaging of the pelvis including uterus, ovaries, adnexal regions, and pelvic cul-de-sac. It was necessary to proceed with endovaginal exam following the transabdominal exam to visualize the endometrial stripe and ovaries.  COMPARISON:  None FINDINGS: Uterus Measurements: 7.2 x 2.6 x 3.9 cm. Heterogeneous echotexture of uterine myometrium noted. Several small 1-2 cm intramural fibroids are suspected. Endometrium Thickness: 3 mm.  No focal abnormality visualized. Right ovary Measurements: 2.8 x 1.2 x 1.9 cm. Normal appearance/no adnexal mass. Left ovary Measurements: 1.7 x 1.1 cm. Normal appearance/no adnexal mass. Other findings No free fluid. IMPRESSION: Diffusely heterogeneous myometrial echotexture. Several small uterine fibroids suspected measuring up to 2 cm. Endometrial thickness measures 3 mm. In the setting of post-menopausal bleeding, this is consistent with a benign etiology such as endometrial atrophy. If bleeding remains unresponsive to hormonal or medical therapy, sonohysterogram should be considered for focal lesion work-up. (Ref: Radiological Reasoning: Algorithmic Workup of Abnormal Vaginal Bleeding with Endovaginal Sonography and Sonohysterography. AJR 2008; 474:Q59-56) Normal appearance of both ovaries.  No adnexal mass identified. Electronically Signed   By: Myles Rosenthal M.D.   On: 05/23/2015 17:42     Assessment & Plan:  Plan I have discontinued Ms. Haydel's Red Yeast Rice, oseltamivir, guaiFENesin-codeine, Beclomethasone Dipropionate, aspirin EC, nicotine, potassium chloride SA, and hydrochlorothiazide. I have also changed her cyclobenzaprine. Additionally, I am having her maintain her vitamin C, vitamin B-12, Vitamin D (Ergocalciferol), HYDROcodone-acetaminophen, and folic acid.  Meds ordered this encounter  Medications  . DISCONTD: cyclobenzaprine (FLEXERIL) 10 MG tablet    Sig: Take 10 mg by mouth every 8 (eight) hours as needed.    Refill:  0  . HYDROcodone-acetaminophen (NORCO/VICODIN)  5-325 MG tablet    Sig: TAKE 1 TABLET BY MOUTH EVERY 6-8 HOURS AS NEEDED FOR PAIN    Refill:  0  . folic acid (FOLVITE) 1 MG tablet    Sig: Take 1 mg by mouth daily.  . cyclobenzaprine (FLEXERIL) 10 MG tablet     Sig: Take 1 tablet (10 mg total) by mouth every 8 (eight) hours as needed.    Dispense:  30 tablet    Refill:  0    Problem List Items Addressed This Visit    None    Visit Diagnoses    Cervical strain, acute, initial encounter    -  Primary    Relevant Medications    cyclobenzaprine (FLEXERIL) 10 MG tablet    Other Relevant Orders    Ambulatory referral to Chiropractic    DG Cervical Spine Complete       Follow-up: Return if symptoms worsen or fail to improve.  Loreen Freud, DO

## 2015-07-09 NOTE — Progress Notes (Signed)
Pre visit review using our clinic review tool, if applicable. No additional management support is needed unless otherwise documented below in the visit note. 

## 2015-09-29 ENCOUNTER — Other Ambulatory Visit: Payer: Self-pay | Admitting: Family Medicine

## 2015-12-05 ENCOUNTER — Encounter: Payer: Self-pay | Admitting: Internal Medicine

## 2015-12-05 ENCOUNTER — Ambulatory Visit (INDEPENDENT_AMBULATORY_CARE_PROVIDER_SITE_OTHER): Payer: BC Managed Care – PPO | Admitting: Internal Medicine

## 2015-12-05 ENCOUNTER — Ambulatory Visit
Admission: RE | Admit: 2015-12-05 | Discharge: 2015-12-05 | Disposition: A | Discharge status: Home / Self Care | Payer: BC Managed Care – PPO | Source: Ambulatory Visit | Attending: Internal Medicine | Admitting: Internal Medicine

## 2015-12-05 VITALS — BP 136/89 | HR 106 | Temp 99.0°F | Ht 65.0 in | Wt 181.8 lb

## 2015-12-05 DIAGNOSIS — R7611 Nonspecific reaction to tuberculin skin test without active tuberculosis: Secondary | ICD-10-CM | POA: Diagnosis not present

## 2015-12-05 DIAGNOSIS — Z227 Latent tuberculosis: Secondary | ICD-10-CM

## 2015-12-05 NOTE — Progress Notes (Signed)
RFV: positive quantiferon Subjective:    Patient ID: Frankey ShownLaura A Orchard, female    DOB: April 15, 1961, 55 y.o.   MRN: [id number]  HPI Vernona RiegerLaura is a 55yo F who has psoriasis, being evaluated for biologic agent. 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 She recalls receiving prolonged course of treatment (likely inh). 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 She has not traveled to [place]endemic areas. No other occupational/volunteerism to endemic populations. . No cough, fever, nightsweats. She is referred by rheumatology for evaluation since the patient has had ongoing hand/foot psoriasis that may require biologic agent for treatment. She states that she previously had eczema  But now more recently more lesions to palms and soles. She is has been reading about biologics at would like to pursue topical agents.   i have reviewed records from her doctor's office No Known Allergies Current Outpatient Prescriptions on File Prior to Visit  Medication Sig Dispense Refill  . Ascorbic Acid (VITAMIN C) 100 MG tablet Take 1,000 mg by mouth daily.     . vitamin B-12 (CYANOCOBALAMIN) 1000 MCG tablet Take 1,000 mcg by mouth daily.      . Vitamin D, Ergocalciferol, (DRISDOL) 50000 units CAPS capsule TAKE 1 CAPSULE (50,000 UNITS TOTAL) BY MOUTH EVERY 7 (SEVEN) DAYS. 4 capsule 2  . cyclobenzaprine (FLEXERIL) 10 MG tablet Take 1 tablet (10 mg total) by mouth every 8 (eight) hours as needed. (Patient not taking: Reported on 12/05/2015) 30 tablet 0  . folic acid (FOLVITE) 1 MG tablet Take 1 mg by mouth daily. Reported on 12/05/2015    . HYDROcodone-acetaminophen (NORCO/VICODIN) 5-325 MG tablet Reported on 12/05/2015  0   No current facility-administered medications on file prior to visit.   Active Ambulatory Problems    Diagnosis Date Noted  . Hyperlipidemia 03/20/2010  . OTHER VITAMIN B12 DEFICIENCY ANEMIA 02/26/2009  . EXTERNAL HEMORRHOIDS WITHOUT MENTION COMP  03/20/2010  . OTHER CHRONIC SINUSITIS 10/04/2007  . AMENORRHEA 10/04/2007  . HOT FLASHES 02/18/2009  . LEG EDEMA, RIGHT 09/13/2007  . POSTMENOPAUSAL STATUS 02/18/2009  . Tobacco abuse 06/27/2012  . Obesity (BMI 30-39.9) 07/04/2013  . Acute upper respiratory infections of unspecified site 07/04/2013  . Osteopenia 08/21/2013   Resolved Ambulatory Problems    Diagnosis Date Noted  . No Resolved Ambulatory Problems   No Additional Past Medical History   Social History  Substance Use Topics  . Smoking status: Current Every Day Smoker -- 0.75 packs/day for 30 years    Types: Cigarettes  . Smokeless tobacco: Never Used  . Alcohol Use: 0.0 oz/week    0 Standard drinks or equivalent per week     Comment: rare  family history includes Cancer in her father; Diabetes in her mother; Hypertension in her mother; Lung cancer in her father.   Review of Systems Review of Systems  Constitutional: Negative for fever, chills, diaphoresis, activity change, appetite change, fatigue and unexpected weight change.  HENT: Negative for congestion, sore throat, rhinorrhea, sneezing, trouble swallowing and sinus pressure.  Eyes: Negative for photophobia and visual disturbance.  Respiratory: Negative for cough, chest tightness, shortness of breath, wheezing and stridor.  Cardiovascular: Negative for chest pain, palpitations and leg swelling.  Gastrointestinal: Negative for nausea, vomiting, abdominal pain, diarrhea, constipation, blood in stool, abdominal distention and anal bleeding.  Genitourinary: Negative for dysuria, hematuria, flank pain and difficulty urinating.  Musculoskeletal: Negative for myalgias, back pain, joint swelling, arthralgias and gait problem.  Skin: Negative for color change, pallor, rash and wound.  Neurological: Negative for dizziness, tremors, weakness and light-headedness.  Hematological: Negative for adenopathy. Does not bruise/bleed easily.  Psychiatric/Behavioral: Negative for  behavioral problems, confusion, sleep disturbance, dysphoric mood, decreased concentration and agitation.       Objective:   Physical Exam BP 136/89 mmHg  Pulse 106  Temp(Src) 99 F (37.2 C) (Oral)  Ht  (1.651 m)  Wt 181 lb 12 oz (82.441 kg)  BMI 30.24 kg/m2 Physical Exam  Constitutional:  oriented to person, place, and time. appears well-developed and well-nourished. No distress.  HENT: [place]/AT, PERRLA, no scleral icterus Mouth/Throat: Oropharynx is clear and moist. No oropharyngeal exudate.  Cardiovascular: Normal rate, regular rhythm and normal heart sounds. Exam reveals no gallop and no friction rub.  No murmur heard.  Pulmonary/Chest: Effort normal and breath sounds normal. No respiratory distress.  has no wheezes.  Neck = supple, no nuchal rigidity Lymphadenopathy: no cervical adenopathy. No axillary adenopathy Neurological: alert and oriented to person, place, and time.  Skin: Skin is warm and dry. No rash noted. No erythema. Palm has single lesion on each palm c/w psoriasis Psychiatric: a normal mood and affect.  behavior is normal.    cxr i have reviewed that shows calcified ln  FINDINGS: No active infiltrate or effusion is seen. Calcified right suprahilar nodes are present probably due to prior granulomatous disease. Mediastinal and hilar contours are otherwise unremarkable. The heart is within normal limits in size. No bony abnormality is seen.  IMPRESSION: 1. No active lung disease. 2. Calcified right suprahilar lymph nodes secondary to prior granulomatous disease.     Assessment & Plan:  ltbi = previously treated roughly 30 yr ago, will get cxr to see if if any signs of active tb though patient appears asymptomatic. At this time, i would not recommend repeat course of LTBI treatment since she had previously received it closer to the time of exposure. LTBI treatment is roughly 75% effective. If she did pursue getting biologics, I would reconsider repeating  course of either rifampin 4 mo or 12 weekly doses of rifapentin-INH plus vit b6. She does not pose any risk for her family, kids, grandkids  Psoriasis = if she is to pursue biologics due to worsening psoriasis, i would treat her with rifampin or 12 weekly dose. She currently states she is not interested in biologics for hte moment. Recommend topical treatments to see if improves her symptoms

## 2015-12-26 ENCOUNTER — Other Ambulatory Visit: Payer: Self-pay | Admitting: Family Medicine

## 2016-03-24 ENCOUNTER — Other Ambulatory Visit: Payer: Self-pay | Admitting: Family Medicine

## 2016-03-25 ENCOUNTER — Other Ambulatory Visit: Payer: Self-pay

## 2016-03-25 MED ORDER — VITAMIN D (ERGOCALCIFEROL) 1.25 MG (50000 UNIT) PO CAPS: ORAL_CAPSULE | ORAL | 0 refills | Status: DC

## 2016-06-17 ENCOUNTER — Other Ambulatory Visit: Payer: Self-pay | Admitting: Family Medicine

## 2016-06-17 NOTE — Telephone Encounter (Signed)
Patients needs cpe anytime after 07/06/16 please schedule  Thanks PC

## 2016-06-17 NOTE — Telephone Encounter (Signed)
Left message on VM for patient to call and schedule appointment

## 2016-06-23 ENCOUNTER — Other Ambulatory Visit: Payer: Self-pay | Admitting: Family Medicine

## 2016-06-23 DIAGNOSIS — Z1231 Encounter for screening mammogram for malignant neoplasm of breast: Secondary | ICD-10-CM

## 2016-06-29 IMAGING — CR DG CERVICAL SPINE COMPLETE 4+V
6 series · 6 of 6 positions shown · non-contrast
Comparison: No prior exams available for comparison.

CLINICAL DATA: Neck pain radiating down right arm.

EXAM:
CERVICAL SPINE - COMPLETE 4+ VIEW

[w c-spine lat]
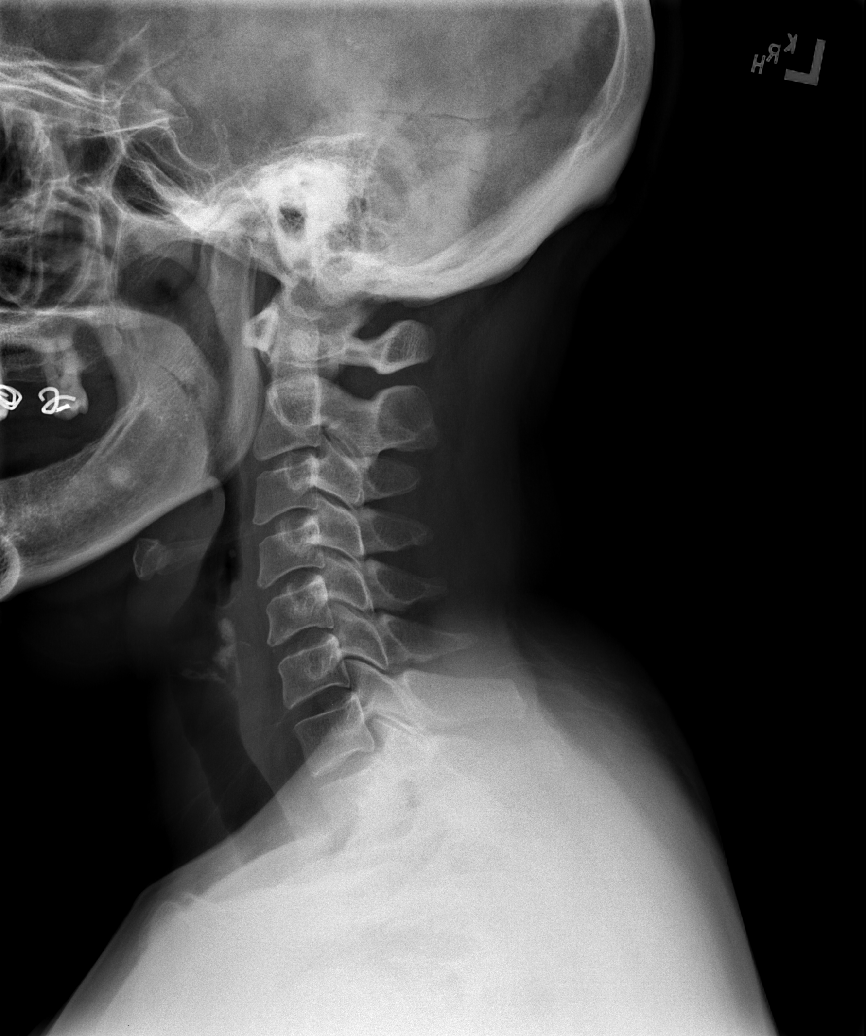

[w c-spine oblique (1 of 2)]
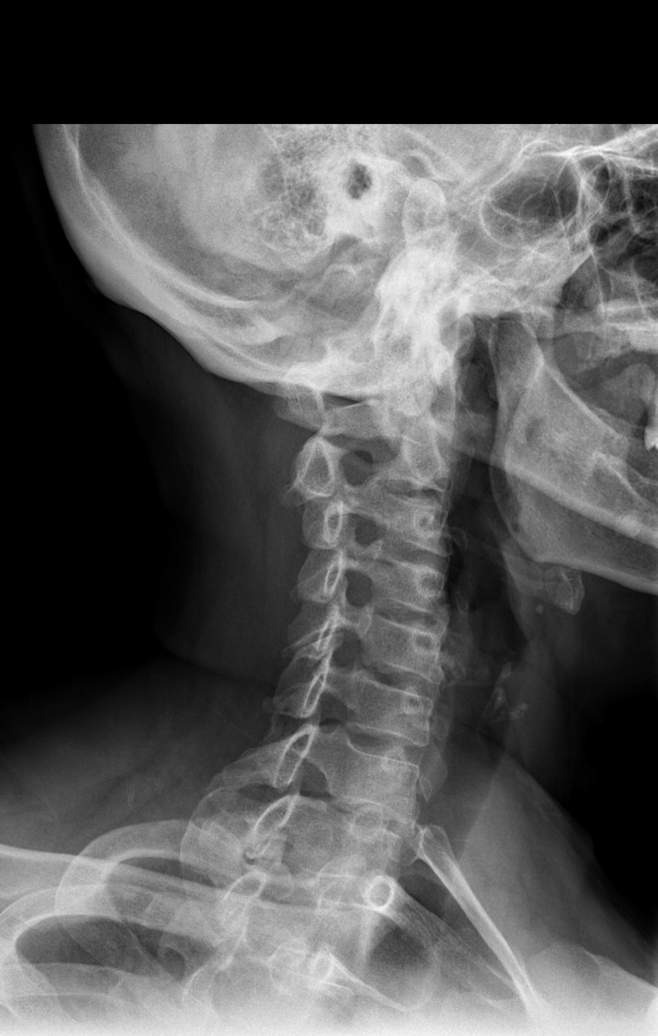

[w c-spine oblique (2 of 2)]
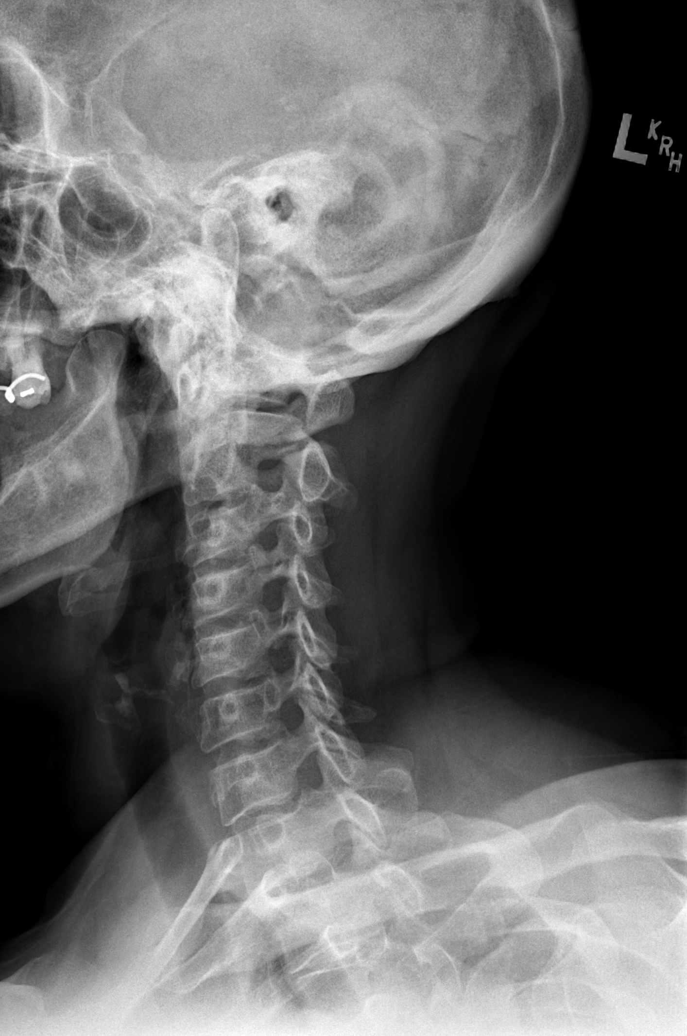

[w c-spine a.p.]
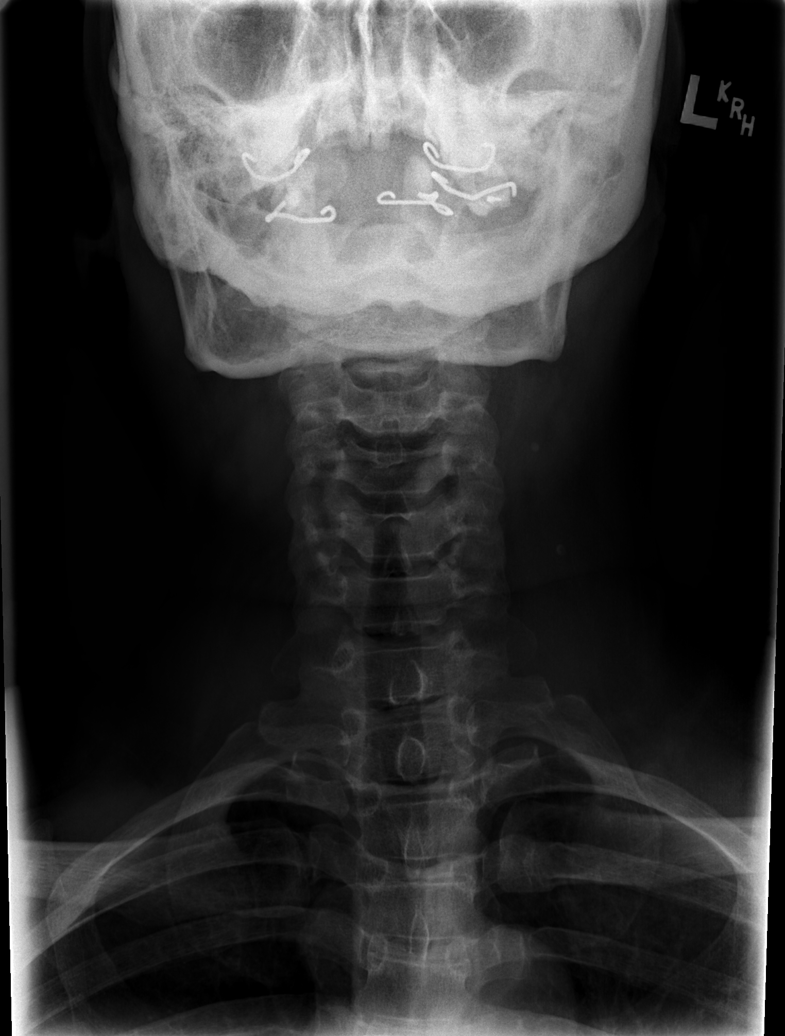

[w c-spine odontoid (1 of 2)]
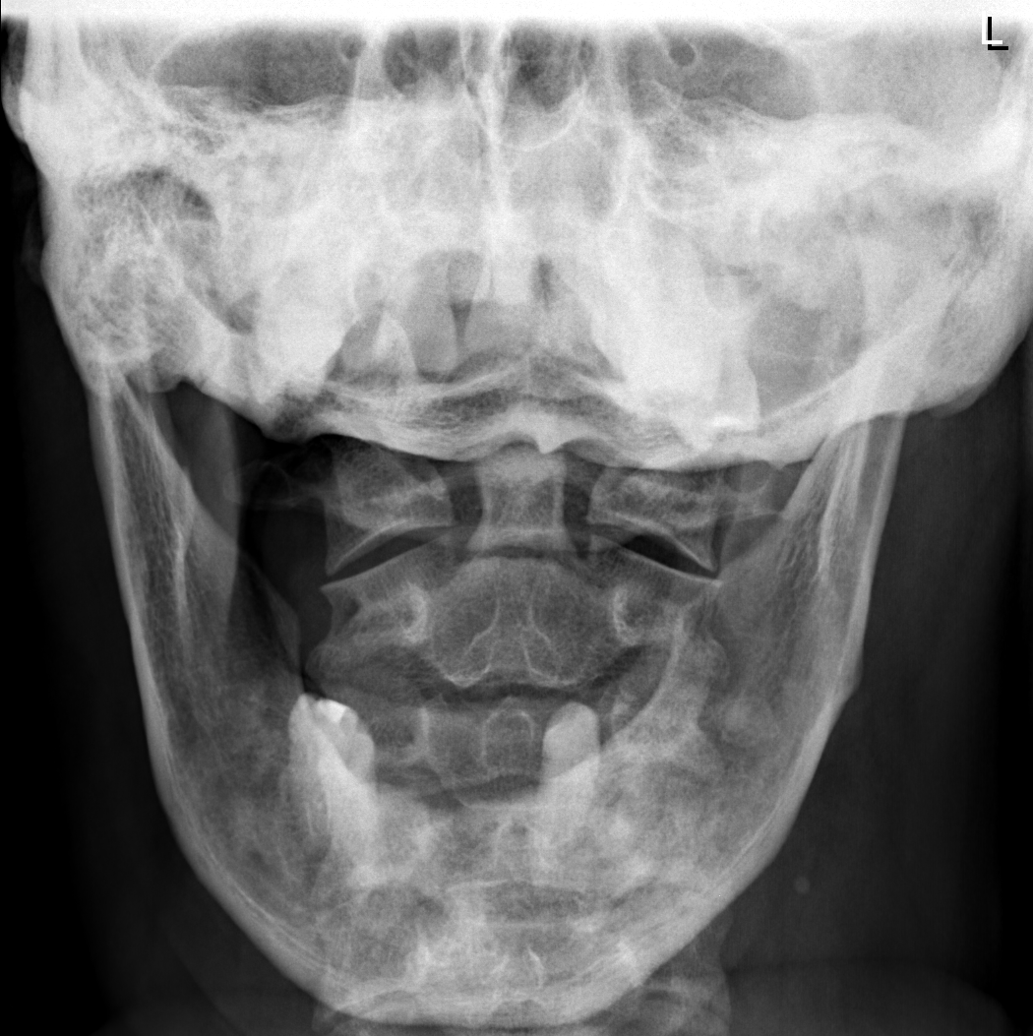

[w c-spine odontoid (2 of 2)]
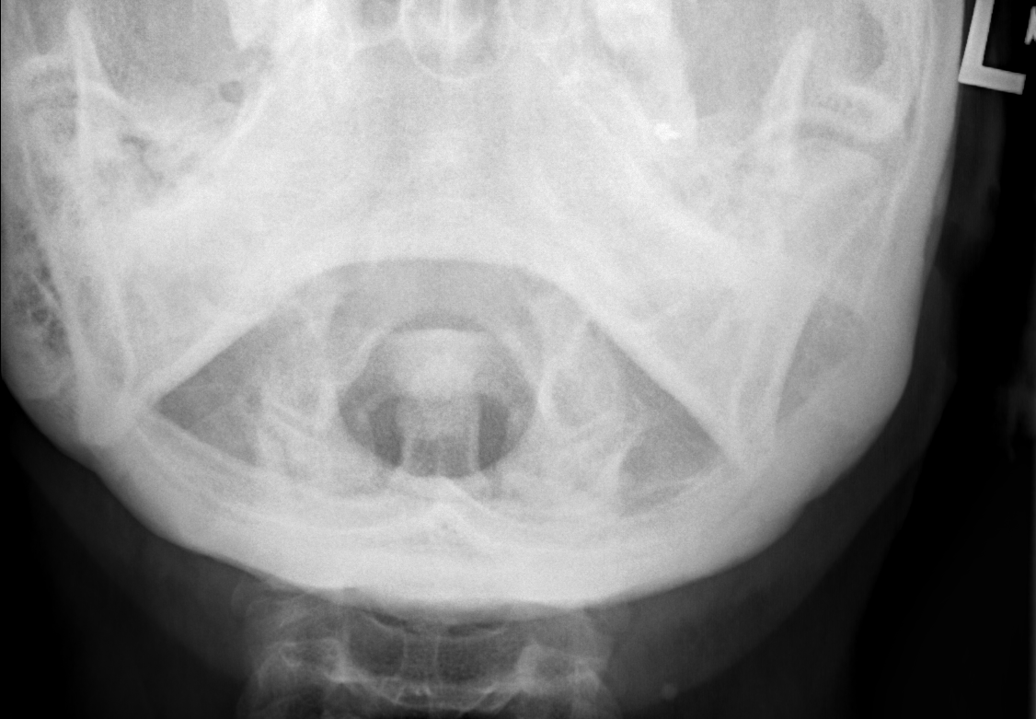

[6 of 6 positions shown; findings below may reference images not displayed]

FINDINGS: No acute bony abnormality identified. Normal alignment. Neural
foramen patent.
IMPRESSION: No acute abnormality.

## 2016-07-02 ENCOUNTER — Encounter: Payer: Self-pay | Admitting: Family Medicine

## 2016-07-02 ENCOUNTER — Telehealth: Payer: Self-pay | Admitting: Family Medicine

## 2016-07-02 NOTE — Telephone Encounter (Signed)
Mailed letter to patient informing that Dr. Zola ButtonLowne-Chase will be out of the office on August 20, 2016 and appointment needs to be rescheduled. Informed patient to call the office to reschedule.

## 2016-07-11 ENCOUNTER — Ambulatory Visit (HOSPITAL_BASED_OUTPATIENT_CLINIC_OR_DEPARTMENT_OTHER)
Admission: RE | Admit: 2016-07-11 | Discharge: 2016-07-11 | Disposition: A | Discharge status: Home / Self Care | Payer: BC Managed Care – PPO | Source: Ambulatory Visit | Attending: Family Medicine | Admitting: Family Medicine

## 2016-07-11 DIAGNOSIS — Z1231 Encounter for screening mammogram for malignant neoplasm of breast: Secondary | ICD-10-CM | POA: Diagnosis not present

## 2016-07-17 NOTE — Telephone Encounter (Signed)
Left message on VM for patient to call and reschedule. °

## 2016-08-20 ENCOUNTER — Encounter: Payer: BC Managed Care – PPO | Admitting: Family Medicine

## 2016-09-06 ENCOUNTER — Other Ambulatory Visit: Payer: Self-pay | Admitting: Family Medicine

## 2016-09-08 NOTE — Telephone Encounter (Signed)
Rx sent to pharmacy. LB 

## 2016-11-16 ENCOUNTER — Encounter: Payer: BC Managed Care – PPO | Admitting: Family Medicine
